# Patient Record
Sex: Male | Born: 1965 | Race: White | Hispanic: No | Marital: Married | Smoking: Never smoker
Health system: Southern US, Community
[De-identification: ages and names within clinical notes are randomized; demographics above are authoritative.]

## PROBLEM LIST (undated history)

## (undated) DIAGNOSIS — Z9289 Personal history of other medical treatment: Secondary | ICD-10-CM

## (undated) DIAGNOSIS — R112 Nausea with vomiting, unspecified: Secondary | ICD-10-CM

## (undated) DIAGNOSIS — M1990 Unspecified osteoarthritis, unspecified site: Secondary | ICD-10-CM

## (undated) DIAGNOSIS — Z9889 Other specified postprocedural states: Secondary | ICD-10-CM

## (undated) DIAGNOSIS — R319 Hematuria, unspecified: Secondary | ICD-10-CM

## (undated) DIAGNOSIS — C801 Malignant (primary) neoplasm, unspecified: Secondary | ICD-10-CM

## (undated) HISTORY — PX: INNER EAR SURGERY: SHX679

## (undated) HISTORY — PX: BACK SURGERY: SHX140

## (undated) HISTORY — PX: EYE SURGERY: SHX253

## (undated) HISTORY — PX: KNEE SURGERY: SHX244

## (undated) HISTORY — PX: TONSILLECTOMY: SUR1361

## (undated) HISTORY — PX: FRACTURE SURGERY: SHX138

---

## 2002-02-26 ENCOUNTER — Inpatient Hospital Stay (HOSPITAL_COMMUNITY): Admission: EM | Admit: 2002-02-26 | Discharge: 2002-03-05 | Payer: Self-pay | Admitting: *Deleted

## 2002-02-26 ENCOUNTER — Encounter: Payer: Self-pay | Admitting: Orthopedic Surgery

## 2002-03-02 ENCOUNTER — Encounter: Payer: Self-pay | Admitting: Orthopedic Surgery

## 2002-03-14 ENCOUNTER — Inpatient Hospital Stay (HOSPITAL_COMMUNITY): Admission: RE | Admit: 2002-03-14 | Discharge: 2002-03-18 | Payer: Self-pay | Admitting: Orthopedic Surgery

## 2002-03-14 ENCOUNTER — Encounter: Payer: Self-pay | Admitting: Orthopedic Surgery

## 2002-04-06 ENCOUNTER — Ambulatory Visit (HOSPITAL_COMMUNITY): Admission: RE | Admit: 2002-04-06 | Discharge: 2002-04-06 | Payer: Self-pay | Admitting: Orthopedic Surgery

## 2002-05-18 ENCOUNTER — Encounter: Payer: Self-pay | Admitting: Orthopedic Surgery

## 2002-05-18 ENCOUNTER — Inpatient Hospital Stay (HOSPITAL_COMMUNITY): Admission: RE | Admit: 2002-05-18 | Discharge: 2002-05-20 | Payer: Self-pay | Admitting: Orthopedic Surgery

## 2004-09-09 ENCOUNTER — Emergency Department (HOSPITAL_COMMUNITY): Admission: EM | Admit: 2004-09-09 | Discharge: 2004-09-09 | Payer: Self-pay | Admitting: Emergency Medicine

## 2012-09-06 ENCOUNTER — Other Ambulatory Visit: Payer: Self-pay | Admitting: Urology

## 2012-10-18 ENCOUNTER — Encounter (HOSPITAL_COMMUNITY): Payer: Self-pay | Admitting: Pharmacy Technician

## 2012-10-26 NOTE — Patient Instructions (Addendum)
[address] Madix D Shere  10/26/2012   Your procedure is scheduled on:  11/02/12  [facility]  Report to [facility] at    0915   AM.  Call this number if you have problems the morning of surgery: [phone]       Remember:   Do not eat food :After Midnight. Monday NIGHT as directed by office for bowel prep-   BEGIN CLEAR LIQUIDS Tuesday MORNING-  Increase fluid intake and drink until MIDNIGHT OR BEDTIME Tuesday NIGHT,  THEN NOTHING BY MOUTH AFTER MIDNIGHT Tuesday NIGHT   Take these medicines the morning of surgery with A SIP OF WATER: NONE   .  Contacts, dentures or partial plates can not be worn to surgery  Leave suitcase in the car. After surgery it may be brought to your room.  For patients admitted to the hospital, checkout time is 11:00 AM day of  discharge.             SPECIAL INSTRUCTIONS- SEE [place] PREPARING FOR SURGERY INSTRUCTION SHEET-     DO NOT WEAR JEWELRY, LOTIONS, POWDERS, OR PERFUMES.  WOMEN-- DO NOT SHAVE LEGS OR UNDERARMS FOR 12 HOURS BEFORE SHOWERS. MEN MAY SHAVE FACE.  Patients discharged the day of surgery will not be allowed to drive home. IF going home the day of surgery, you must have a driver and someone to stay with you for the first 24 hours  Name and phone number of your driver:      Wife TERESA                                                                  Please read over the following fact sheets that you were given: MRSA Information, Incentive Spirometry Sheet, Blood Transfusion Sheet  Information                                                                                   Kerriann Kamphuis  PST [phone]  [phone]                 FAILURE TO FOLLOW THESE INSTRUCTIONS MAY RESULT IN  CANCELLATION   OF YOUR SURGERY                                                  Patient Signature _____________________________

## 2012-10-27 ENCOUNTER — Encounter (HOSPITAL_COMMUNITY)
Admission: RE | Admit: 2012-10-27 | Discharge: 2012-10-27 | Disposition: A | Discharge status: Home / Self Care | Payer: Managed Care, Other (non HMO) | Source: Ambulatory Visit | Attending: Urology | Admitting: Urology

## 2012-10-27 ENCOUNTER — Encounter (HOSPITAL_COMMUNITY): Payer: Self-pay

## 2012-10-27 HISTORY — DX: Other specified postprocedural states: Z98.890

## 2012-10-27 HISTORY — DX: Unspecified osteoarthritis, unspecified site: M19.90

## 2012-10-27 HISTORY — DX: Personal history of other medical treatment: Z92.89

## 2012-10-27 HISTORY — DX: Nausea with vomiting, unspecified: R11.2

## 2012-10-27 HISTORY — DX: Malignant (primary) neoplasm, unspecified: C80.1

## 2012-10-27 LAB — CBC
HCT: 51.5 % (ref 39.0–52.0)
MCH: 30.7 pg (ref 26.0–34.0)
Platelets: 247 10*3/uL (ref 150–400)
RBC: 5.38 MIL/uL (ref 4.22–5.81)
WBC: 4.5 10*3/uL (ref 4.0–10.5)

## 2012-10-27 LAB — BASIC METABOLIC PANEL
Chloride: 101 mEq/L (ref 96–112)
Creatinine, Ser: 0.96 mg/dL (ref 0.50–1.35)
GFR calc non Af Amer: >90 mL/min (ref >90)
Potassium: 4.1 mEq/L (ref 3.5–5.1)

## 2012-11-02 ENCOUNTER — Encounter (HOSPITAL_COMMUNITY): Payer: Self-pay | Admitting: Anesthesiology

## 2012-11-02 ENCOUNTER — Ambulatory Visit (HOSPITAL_COMMUNITY): Payer: Managed Care, Other (non HMO) | Admitting: Anesthesiology

## 2012-11-02 ENCOUNTER — Observation Stay (HOSPITAL_COMMUNITY)
Admission: RE | Admit: 2012-11-02 | Discharge: 2012-11-03 | Disposition: A | Discharge status: Home / Self Care | Payer: Managed Care, Other (non HMO) | Source: Ambulatory Visit | Attending: Urology | Admitting: Urology

## 2012-11-02 ENCOUNTER — Encounter (HOSPITAL_COMMUNITY)
Admission: RE | Disposition: A | Discharge status: Home / Self Care | Payer: Self-pay | Source: Ambulatory Visit | Attending: Urology

## 2012-11-02 ENCOUNTER — Encounter (HOSPITAL_COMMUNITY): Payer: Self-pay | Admitting: General Practice

## 2012-11-02 DIAGNOSIS — R3915 Urgency of urination: Secondary | ICD-10-CM | POA: Insufficient documentation

## 2012-11-02 DIAGNOSIS — Z01812 Encounter for preprocedural laboratory examination: Secondary | ICD-10-CM | POA: Insufficient documentation

## 2012-11-02 DIAGNOSIS — C61 Malignant neoplasm of prostate: Principal | ICD-10-CM | POA: Insufficient documentation

## 2012-11-02 DIAGNOSIS — S90569A Insect bite (nonvenomous), unspecified ankle, initial encounter: Secondary | ICD-10-CM | POA: Insufficient documentation

## 2012-11-02 DIAGNOSIS — S30860A Insect bite (nonvenomous) of lower back and pelvis, initial encounter: Secondary | ICD-10-CM | POA: Insufficient documentation

## 2012-11-02 DIAGNOSIS — W57XXXA Bitten or stung by nonvenomous insect and other nonvenomous arthropods, initial encounter: Secondary | ICD-10-CM | POA: Insufficient documentation

## 2012-11-02 DIAGNOSIS — R21 Rash and other nonspecific skin eruption: Secondary | ICD-10-CM | POA: Insufficient documentation

## 2012-11-02 HISTORY — PX: ROBOT ASSISTED LAPAROSCOPIC RADICAL PROSTATECTOMY: SHX5141

## 2012-11-02 HISTORY — PX: LYMPHADENECTOMY: SHX5960

## 2012-11-02 LAB — CBC
HCT: 45 % (ref 39.0–52.0)
MCH: 31.1 pg (ref 26.0–34.0)
MCV: 87.9 fL (ref 78.0–100.0)
Platelets: 238 10*3/uL (ref 150–400)
RDW: 11.8 % (ref 11.5–15.5)

## 2012-11-02 LAB — TYPE AND SCREEN

## 2012-11-02 SURGERY — ROBOTIC ASSISTED LAPAROSCOPIC RADICAL PROSTATECTOMY LEVEL 2
Anesthesia: General | Site: Abdomen | Wound class: Clean Contaminated

## 2012-11-02 MED ORDER — HYOSCYAMINE SULFATE 0.125 MG SL SUBL
0.1250 mg | SUBLINGUAL_TABLET | SUBLINGUAL | Status: DC | PRN
  Filled 2012-11-02: qty 1

## 2012-11-02 MED ORDER — LACTATED RINGERS IV SOLN: INTRAVENOUS | Status: DC

## 2012-11-02 MED ORDER — BELLADONNA ALKALOIDS-OPIUM 16.2-60 MG RE SUPP
1.0000 | Freq: Four times a day (QID) | RECTAL | Status: DC | PRN
  Administered 2012-11-02: 1 via RECTAL
  Filled 2012-11-02: qty 1

## 2012-11-02 MED ORDER — SODIUM CHLORIDE 0.9 % IV BOLUS (SEPSIS): 1000.0000 mL | Freq: Once | INTRAVENOUS | Status: DC

## 2012-11-02 MED ORDER — PROPOFOL 10 MG/ML IV BOLUS
INTRAVENOUS | Status: DC | PRN
  Administered 2012-11-02: 200 mg via INTRAVENOUS

## 2012-11-02 MED ORDER — OXYCODONE HCL 5 MG PO TABS: 5.0000 mg | ORAL_TABLET | Freq: Once | ORAL | Status: DC | PRN

## 2012-11-02 MED ORDER — ACETAMINOPHEN 10 MG/ML IV SOLN
INTRAVENOUS | Status: AC
  Filled 2012-11-02: qty 100

## 2012-11-02 MED ORDER — MORPHINE SULFATE 2 MG/ML IJ SOLN
2.0000 mg | INTRAMUSCULAR | Status: DC | PRN
  Administered 2012-11-03: 4 mg via INTRAVENOUS
  Filled 2012-11-02: qty 2

## 2012-11-02 MED ORDER — HEPARIN SODIUM (PORCINE) 5000 UNIT/ML IJ SOLN
5000.0000 [IU] | Freq: Three times a day (TID) | INTRAMUSCULAR | Status: DC
Start: 2012-11-03 — End: 2012-11-03
  Administered 2012-11-03: 5000 [IU] via SUBCUTANEOUS
  Filled 2012-11-02 (×4): qty 1

## 2012-11-02 MED ORDER — HYDROCODONE-ACETAMINOPHEN 5-325 MG PO TABS: 1.0000 | ORAL_TABLET | Freq: Four times a day (QID) | ORAL | Status: DC | PRN

## 2012-11-02 MED ORDER — ONDANSETRON HCL 4 MG/2ML IJ SOLN
4.0000 mg | Freq: Four times a day (QID) | INTRAMUSCULAR | Status: DC | PRN
  Administered 2012-11-02 – 2012-11-03 (×2): 4 mg via INTRAVENOUS
  Filled 2012-11-02 (×2): qty 2

## 2012-11-02 MED ORDER — HYDROMORPHONE HCL PF 1 MG/ML IJ SOLN
0.2500 mg | INTRAMUSCULAR | Status: DC | PRN
  Administered 2012-11-02 (×2): 0.5 mg via INTRAVENOUS

## 2012-11-02 MED ORDER — CIPROFLOXACIN HCL 500 MG PO TABS: 500.0000 mg | ORAL_TABLET | Freq: Two times a day (BID) | ORAL | Status: DC

## 2012-11-02 MED ORDER — HYDROMORPHONE HCL PF 1 MG/ML IJ SOLN
INTRAMUSCULAR | Status: AC
  Filled 2012-11-02: qty 1

## 2012-11-02 MED ORDER — ACETAMINOPHEN 10 MG/ML IV SOLN
1000.0000 mg | Freq: Four times a day (QID) | INTRAVENOUS | Status: DC
  Administered 2012-11-02 – 2012-11-03 (×3): 1000 mg via INTRAVENOUS
  Filled 2012-11-02 (×4): qty 100

## 2012-11-02 MED ORDER — DEXAMETHASONE SODIUM PHOSPHATE 10 MG/ML IJ SOLN
INTRAMUSCULAR | Status: DC | PRN
  Administered 2012-11-02: 4 mg via INTRAVENOUS

## 2012-11-02 MED ORDER — ATROPINE SULFATE 0.4 MG/ML IJ SOLN
INTRAMUSCULAR | Status: DC | PRN
  Administered 2012-11-02: 0.4 mg via INTRAVENOUS

## 2012-11-02 MED ORDER — LACTATED RINGERS IV SOLN
INTRAVENOUS | Status: DC | PRN
  Administered 2012-11-02: 14:00:00

## 2012-11-02 MED ORDER — DOCUSATE SODIUM 100 MG PO CAPS
100.0000 mg | ORAL_CAPSULE | Freq: Two times a day (BID) | ORAL | Status: DC
  Filled 2012-11-02 (×2): qty 1

## 2012-11-02 MED ORDER — STERILE WATER FOR IRRIGATION IR SOLN
Status: DC | PRN
  Administered 2012-11-02: 3000 mL

## 2012-11-02 MED ORDER — NEOSTIGMINE METHYLSULFATE 1 MG/ML IJ SOLN
INTRAMUSCULAR | Status: DC | PRN
  Administered 2012-11-02: 4 mg via INTRAVENOUS

## 2012-11-02 MED ORDER — HEPARIN SODIUM (PORCINE) 5000 UNIT/ML IJ SOLN
5000.0000 [IU] | INTRAMUSCULAR | Status: AC
  Administered 2012-11-02: 5000 [IU] via SUBCUTANEOUS
  Filled 2012-11-02: qty 1

## 2012-11-02 MED ORDER — CEFAZOLIN SODIUM-DEXTROSE 2-3 GM-% IV SOLR
2.0000 g | INTRAVENOUS | Status: AC
  Administered 2012-11-02: 2 g via INTRAVENOUS

## 2012-11-02 MED ORDER — OXYCODONE-ACETAMINOPHEN 5-325 MG PO TABS: 1.0000 | ORAL_TABLET | ORAL | Status: DC | PRN

## 2012-11-02 MED ORDER — HYDROMORPHONE HCL PF 1 MG/ML IJ SOLN
INTRAMUSCULAR | Status: DC | PRN
  Administered 2012-11-02 (×5): .4 mg via INTRAVENOUS

## 2012-11-02 MED ORDER — ACETAMINOPHEN 10 MG/ML IV SOLN
INTRAVENOUS | Status: DC | PRN
  Administered 2012-11-02: 1000 mg via INTRAVENOUS

## 2012-11-02 MED ORDER — INDIGOTINDISULFONATE SODIUM 8 MG/ML IJ SOLN
INTRAMUSCULAR | Status: AC
  Filled 2012-11-02: qty 10

## 2012-11-02 MED ORDER — HEPARIN SODIUM (PORCINE) 1000 UNIT/ML IJ SOLN
INTRAMUSCULAR | Status: AC
  Filled 2012-11-02: qty 1

## 2012-11-02 MED ORDER — ACETAMINOPHEN 10 MG/ML IV SOLN: 1000.0000 mg | Freq: Once | INTRAVENOUS | Status: DC | PRN

## 2012-11-02 MED ORDER — LIDOCAINE HCL (PF) 2 % IJ SOLN
INTRAMUSCULAR | Status: DC | PRN
  Administered 2012-11-02: 100 mg

## 2012-11-02 MED ORDER — GLYCOPYRROLATE 0.2 MG/ML IJ SOLN
INTRAMUSCULAR | Status: DC | PRN
  Administered 2012-11-02: 0.2 mg via INTRAVENOUS
  Administered 2012-11-02: .6 mg via INTRAVENOUS

## 2012-11-02 MED ORDER — OXYCODONE HCL 5 MG/5ML PO SOLN: 5.0000 mg | Freq: Once | ORAL | Status: DC | PRN

## 2012-11-02 MED ORDER — INDIGOTINDISULFONATE SODIUM 8 MG/ML IJ SOLN
INTRAMUSCULAR | Status: DC | PRN
  Administered 2012-11-02 (×2): 5 mL via INTRAVENOUS

## 2012-11-02 MED ORDER — SODIUM CHLORIDE 0.9 % IR SOLN
Status: DC | PRN
  Administered 2012-11-02: 1000 mL

## 2012-11-02 MED ORDER — MEPERIDINE HCL 50 MG/ML IJ SOLN: 6.2500 mg | INTRAMUSCULAR | Status: DC | PRN

## 2012-11-02 MED ORDER — CEFAZOLIN SODIUM-DEXTROSE 2-3 GM-% IV SOLR
INTRAVENOUS | Status: AC
  Filled 2012-11-02: qty 50

## 2012-11-02 MED ORDER — BUPIVACAINE-EPINEPHRINE PF 0.25-1:200000 % IJ SOLN
INTRAMUSCULAR | Status: AC
  Filled 2012-11-02: qty 30

## 2012-11-02 MED ORDER — SENNOSIDES-DOCUSATE SODIUM 8.6-50 MG PO TABS
2.0000 | ORAL_TABLET | Freq: Every day | ORAL | Status: DC
  Administered 2012-11-02: 2 via ORAL
  Filled 2012-11-02 (×2): qty 2

## 2012-11-02 MED ORDER — ONDANSETRON HCL 4 MG/2ML IJ SOLN
INTRAMUSCULAR | Status: DC | PRN
  Administered 2012-11-02: 4 mg via INTRAVENOUS

## 2012-11-02 MED ORDER — BUPIVACAINE LIPOSOME 1.3 % IJ SUSP
20.0000 mL | Freq: Once | INTRAMUSCULAR | Status: DC
  Filled 2012-11-02: qty 20

## 2012-11-02 MED ORDER — LACTATED RINGERS IV SOLN
INTRAVENOUS | Status: DC | PRN
  Administered 2012-11-02 (×2): via INTRAVENOUS

## 2012-11-02 MED ORDER — SODIUM CHLORIDE 0.9 % IJ SOLN
INTRAMUSCULAR | Status: DC | PRN
  Administered 2012-11-02: 14:00:00

## 2012-11-02 MED ORDER — FENTANYL CITRATE 0.05 MG/ML IJ SOLN
INTRAMUSCULAR | Status: DC | PRN
  Administered 2012-11-02 (×5): 100 ug via INTRAVENOUS

## 2012-11-02 MED ORDER — PROMETHAZINE HCL 25 MG/ML IJ SOLN: 6.2500 mg | INTRAMUSCULAR | Status: DC | PRN

## 2012-11-02 MED ORDER — DIPHENHYDRAMINE HCL 12.5 MG/5ML PO ELIX: 12.5000 mg | ORAL_SOLUTION | Freq: Four times a day (QID) | ORAL | Status: DC | PRN

## 2012-11-02 MED ORDER — CISATRACURIUM BESYLATE (PF) 10 MG/5ML IV SOLN
INTRAVENOUS | Status: DC | PRN
  Administered 2012-11-02: 6 mg via INTRAVENOUS
  Administered 2012-11-02: 14 mg via INTRAVENOUS
  Administered 2012-11-02 (×3): 4 mg via INTRAVENOUS
  Administered 2012-11-02: 6 mg via INTRAVENOUS

## 2012-11-02 MED ORDER — SODIUM CHLORIDE 0.9 % IV SOLN
INTRAVENOUS | Status: DC
  Administered 2012-11-02 – 2012-11-03 (×2): via INTRAVENOUS

## 2012-11-02 MED ORDER — MIDAZOLAM HCL 5 MG/5ML IJ SOLN
INTRAMUSCULAR | Status: DC | PRN
  Administered 2012-11-02: 2 mg via INTRAVENOUS

## 2012-11-02 MED ORDER — DIPHENHYDRAMINE HCL 50 MG/ML IJ SOLN: 12.5000 mg | Freq: Four times a day (QID) | INTRAMUSCULAR | Status: DC | PRN

## 2012-11-02 SURGICAL SUPPLY — 57 items
APPLIER CLIP 5 13 M/L LIGAMAX5 (MISCELLANEOUS) ×3
APR CLP MED LRG 5 ANG JAW (MISCELLANEOUS) ×2
CANISTER SUCTION 2500CC (MISCELLANEOUS) ×3 IMPLANT
CATH FOLEY 2WAY SLVR 30CC 16FR (CATHETERS) ×3 IMPLANT
CATH ROBINSON RED A/P 16FR (CATHETERS) ×3 IMPLANT
CATH ROBINSON RED A/P 8FR (CATHETERS) ×3 IMPLANT
CATH TIEMANN FOLEY 18FR 5CC (CATHETERS) ×3 IMPLANT
CHLORAPREP W/TINT 26ML (MISCELLANEOUS) ×3 IMPLANT
CLIP APPLIE 5 13 M/L LIGAMAX5 (MISCELLANEOUS) ×2 IMPLANT
CLIP LIGATING HEM O LOK PURPLE (MISCELLANEOUS) ×7 IMPLANT
CLOTH BEACON ORANGE TIMEOUT ST (SAFETY) ×3 IMPLANT
CORD HIGH FREQUENCY UNIPOLAR (ELECTROSURGICAL) ×3 IMPLANT
COVER SURGICAL LIGHT HANDLE (MISCELLANEOUS) ×3 IMPLANT
COVER TIP SHEARS 8 DVNC (MISCELLANEOUS) ×2 IMPLANT
COVER TIP SHEARS 8MM DA VINCI (MISCELLANEOUS) ×1
CUTTER ECHEON FLEX ENDO 45 340 (ENDOMECHANICALS) ×3 IMPLANT
DECANTER SPIKE VIAL GLASS SM (MISCELLANEOUS) ×3 IMPLANT
DRAPE SURG IRRIG POUCH 19X23 (DRAPES) ×3 IMPLANT
DRSG TEGADERM 6X8 (GAUZE/BANDAGES/DRESSINGS) ×6 IMPLANT
ELECT REM PT RETURN 9FT ADLT (ELECTROSURGICAL) ×3
ELECTRODE REM PT RTRN 9FT ADLT (ELECTROSURGICAL) ×2 IMPLANT
GAUZE SPONGE 2X2 8PLY STRL LF (GAUZE/BANDAGES/DRESSINGS) ×2 IMPLANT
GLOVE BIOGEL M 7.0 STRL (GLOVE) IMPLANT
GLOVE BIOGEL M STRL SZ7.5 (GLOVE) ×1 IMPLANT
GLOVE BIOGEL PI IND STRL 6.5 (GLOVE) IMPLANT
GLOVE BIOGEL PI IND STRL 7.5 (GLOVE) ×2 IMPLANT
GLOVE BIOGEL PI INDICATOR 6.5 (GLOVE) ×2
GLOVE BIOGEL PI INDICATOR 7.5 (GLOVE) ×3
GLOVE ECLIPSE 7.0 STRL STRAW (GLOVE) ×4 IMPLANT
GLOVE SURG SS PI 8.5 STRL IVOR (GLOVE) ×2
GLOVE SURG SS PI 8.5 STRL STRW (GLOVE) IMPLANT
GOWN PREVENTION PLUS XLARGE (GOWN DISPOSABLE) ×4 IMPLANT
GOWN STRL NON-REIN LRG LVL3 (GOWN DISPOSABLE) ×6 IMPLANT
GOWN STRL REIN 2XL XLG LVL4 (GOWN DISPOSABLE) ×1 IMPLANT
GOWN STRL REIN 3XL XLG LVL4 (GOWN DISPOSABLE) ×1 IMPLANT
GOWN STRL REIN XL XLG (GOWN DISPOSABLE) ×7 IMPLANT
HOLDER FOLEY CATH W/STRAP (MISCELLANEOUS) ×3 IMPLANT
IV LACTATED RINGERS 1000ML (IV SOLUTION) ×3 IMPLANT
KIT ACCESSORY DA VINCI DISP (KITS) ×1
KIT ACCESSORY DVNC DISP (KITS) ×2 IMPLANT
NDL SAFETY ECLIPSE 18X1.5 (NEEDLE) ×2 IMPLANT
NEEDLE HYPO 18GX1.5 SHARP (NEEDLE) ×3
PACK ROBOT UROLOGY CUSTOM (CUSTOM PROCEDURE TRAY) ×3 IMPLANT
RELOAD GREEN ECHELON 45 (STAPLE) ×3 IMPLANT
SCRUB PCMX 4 OZ (MISCELLANEOUS) ×1 IMPLANT
SET TUBE IRRIG SUCTION NO TIP (IRRIGATION / IRRIGATOR) ×3 IMPLANT
SOLUTION ELECTROLUBE (MISCELLANEOUS) ×3 IMPLANT
SPONGE GAUZE 2X2 STER 10/PKG (GAUZE/BANDAGES/DRESSINGS) ×1
SUT ETHILON 3 0 PS 1 (SUTURE) ×1 IMPLANT
SUT VICRYL 0 UR6 27IN ABS (SUTURE) ×6 IMPLANT
SUT VLOC BARB 180 ABS3/0GR12 (SUTURE) ×3
SUTURE VLOC BRB 180 ABS3/0GR12 (SUTURE) IMPLANT
SYR 27GX1/2 1ML LL SAFETY (SYRINGE) ×3 IMPLANT
TOWEL OR 17X26 10 PK STRL BLUE (TOWEL DISPOSABLE) ×2 IMPLANT
TOWEL OR NON WOVEN STRL DISP B (DISPOSABLE) ×3 IMPLANT
TROCAR 12M 150ML BLUNT (TROCAR) ×1 IMPLANT
WATER STERILE IRR 1500ML POUR (IV SOLUTION) ×6 IMPLANT

## 2012-11-02 NOTE — Transfer of Care (Signed)
Immediate Anesthesia Transfer of Care Note  Patient: Sean Estes  Procedure(s) Performed: Procedure(s) (LRB): ROBOTIC ASSISTED LAPAROSCOPIC RADICAL PROSTATECTOMY LEVEL 2 (N/A) LYMPHADENECTOMY (Bilateral)  Patient Location: PACU  Anesthesia Type: General  Level of Consciousness: sedated, patient cooperative and responds to stimulaton  Airway & Oxygen Therapy: Patient Spontanous Breathing and Patient connected to face mask oxgen  Post-op Assessment: Report given to PACU RN and Post -op Vital signs reviewed and stable  Post vital signs: Reviewed and stable  Complications: No apparent anesthesia complications

## 2012-11-02 NOTE — Progress Notes (Signed)
Dr. Margarita Grizzle in to see patient, pt. Has poison ivy on bilateral lower legs, one spot on left forearm, and tick marks on lower abdomen, order received to wrap lower legs with ace wraps, and continue with surgery.

## 2012-11-02 NOTE — Anesthesia Preprocedure Evaluation (Addendum)
Anesthesia Evaluation  Patient identified by MRN, date of birth, ID band Patient awake    Reviewed: Allergy & Precautions, H&P , NPO status , Patient's Chart, lab work & pertinent test results  History of Anesthesia Complications (+) PONV  Airway Mallampati: II TM Distance: >3 FB Neck ROM: Full    Dental  (+) Teeth Intact and Dental Advisory Given   Pulmonary neg pulmonary ROS,  breath sounds clear to auscultation        Cardiovascular negative cardio ROS  Rhythm:Regular Rate:Normal     Neuro/Psych negative neurological ROS  negative psych ROS   GI/Hepatic negative GI ROS, Neg liver ROS,   Endo/Other  negative endocrine ROS  Renal/GU negative Renal ROS     Musculoskeletal negative musculoskeletal ROS (+)   Abdominal   Peds  Hematology negative hematology ROS (+)   Anesthesia Other Findings   Reproductive/Obstetrics                           Anesthesia Physical Anesthesia Plan  ASA: II  Anesthesia Plan: General   Post-op Pain Management:    Induction: Intravenous  Airway Management Planned: Oral ETT  Additional Equipment:   Intra-op Plan:   Post-operative Plan: Extubation in OR  Informed Consent: I have reviewed the patients History and Physical, chart, labs and discussed the procedure including the risks, benefits and alternatives for the proposed anesthesia with the patient or authorized representative who has indicated his/her understanding and acceptance.   Dental advisory given  Plan Discussed with: CRNA  Anesthesia Plan Comments:         Anesthesia Quick Evaluation

## 2012-11-02 NOTE — Anesthesia Postprocedure Evaluation (Signed)
Anesthesia Post Note  Patient: Sean Estes  Procedure(s) Performed: Procedure(s) (LRB): ROBOTIC ASSISTED LAPAROSCOPIC RADICAL PROSTATECTOMY LEVEL 2 (N/A) LYMPHADENECTOMY (Bilateral)  Anesthesia type: General  Patient location: PACU  Post pain: Pain level controlled  Post assessment: Post-op Vital signs reviewed  Last Vitals: BP 118/72  Pulse 59  Temp(Src) 36.9 C (Oral)  Resp 12  Ht 6\' 1"  (1.854 m)  Wt 185 lb (83.915 kg)  BMI 24.41 kg/m2  SpO2 98%  Post vital signs: Reviewed  Level of consciousness: sedated  Complications: No apparent anesthesia complications

## 2012-11-02 NOTE — H&P (Signed)
Urology History and Physical Exam  CC: Prostate cancer  HPI:  47 year old male presents today for radical robotic prostatectomy with bilateral pelvic lymph node dissection for prostate cancer. This was discovered on prostate biopsy for an elevated PSA to 5.22. The final pathology report revealed Gleason 3+4 equals 7 involving 4 cores, 3+3 equals 6 involving one core, and atypical glands involving 2 cores. This involved the left base and left lateral portions as well as the bilateral apex. He has preoperative erectile dysfunction for which he is taking levitra. His prostate volume was noted to be small at 11 cc. We have discussed risks, benefits, alternatives, and likelihood of achieving goals.  He presents today with a rash on his bilateral lower trimmed is below the knees. This appears to be some type of contact dermatitis such as poison ivy. He has a tic bite on his left lower quadrant abdomen and his right medial thigh. Neither of these are pustular nor is there a large amount of erythema surrounding them. I feel this is appropriate to continue on with surgery.  PMH: Past Medical History  Diagnosis Date  . History of blood transfusion   . Arthritis   . Cancer     prostate  . PONV (postoperative nausea and vomiting)     PSH: Past Surgical History  Procedure Laterality Date  . Inner ear surgery Right   . Knee surgery Left     ORIF x fracture of 12 bones/plates and screws retained/  LIMITED MOBILITY LEFT LEG  . Back surgery      lumbar/ discectomy  . Eye surgery Right     repair torn retina right with lens implant  . Fracture surgery Left     PATELLA/ LOWER LEG_ LIMITED MOBILITY    Allergies: Allergies  Allergen Reactions  . Codeine     Cant urinate, itching    Medications: No prescriptions prior to admission     Social History: History   Social History  . Marital Status: Married    Spouse Name: N/A    Number of Children: N/A  . Years of Education: N/A    Occupational History  . Not on file.   Social History Main Topics  . Smoking status: Never Smoker   . Smokeless tobacco: Never Used  . Alcohol Use: Yes     Comment: 3-4 shots liquor week  . Drug Use: No  . Sexually Active: Not on file   Other Topics Concern  . Not on file   Social History Narrative  . No narrative on file    Family History: No family history on file.  Review of Systems: Positive: Rash (Mentioned in HPI). Negative: Fever, chest pain, or SOB.  A further 10 point review of systems was negative except what is listed in the HPI.  Physical Exam: Filed Vitals:   11/02/12 0911  BP: 126/86  Pulse: 72  Temp: 98 F (36.7 C)  Resp: 18    General: No acute distress.  Awake. Head:  Normocephalic.  Atraumatic. ENT:  EOMI.  Mucous membranes moist Neck:  Supple.  No lymphadenopathy. CV:  S1 present. S2 present. Regular rate. Pulmonary: Equal effort bilaterally.  Clear to auscultation bilaterally. Abdomen: Soft.  Non- tender to palpation. Skin:  Normal turgor.  No visible rash. Extremity: No gross deformity of bilateral upper extremities.  No gross deformity of    bilateral lower extremities. Bilateral lower extremity papular erythematous rash. Neurologic: Alert. Appropriate mood.    Studies:  No results found  for this basename: HGB, WBC, PLT,  in the last 72 hours  No results found for this basename: NA, K, CL, CO2, BUN, CREATININE, CALCIUM, MAGNESIUM, GFRNONAA, GFRAA,  in the last 72 hours   No results found for this basename: PT, INR, APTT,  in the last 72 hours   No components found with this basename: ABG,     Assessment:  Prostate cancer  Plan: To OR for radical robotic prostatectomy with bilateral pelvic lymph node dissection. Nerve sparing will be determined at the time of surgery.

## 2012-11-02 NOTE — Progress Notes (Signed)
GU post-op check.  This patient is doing well following prostatectomy. Pain controlled. Has urinary urgency.  I explained the surgical findings and course of the surgery to the patient.  PE:  Filed Vitals:   11/02/12 1830  BP: 118/72  Pulse: 59  Temp: 98.4 F (36.9 C)  Resp: 12   Gen: NAD Abd: appropriately TTP, JP draining serosanguinous fluid. GU: foley draining clear yellow urine  Assessment: Prostate cancer  Plan:  Continue IV fluids.  Pain control.  Regular diet.  Ambulate.  Incentive spirometry.

## 2012-11-02 NOTE — Op Note (Signed)
DATE OF PROCEDURE: 11/02/12   OPERATIVE REPORT  SURGEON: Natalia Leatherwood, MD  ASSISTANT:  Pecola Leisure, PA   PREOPERATIVE DIAGNOSIS: Prostate cancer.  POSTOPERATIVE DIAGNOSIS: Prostate cancer.   PROCEDURE PERFORMED:  Robotic-assisted laparoscopic radical prostatectomy Bilateral non-nerve sparing. Bilateral pelvic lymph node dissection.   ESTIMATED BLOOD LOSS: 150 cc.  DRAINS: Jackson-Pratt drain, left lower quadrant and Foley catheter.   SPECIMEN: Prostate sent with seminal vesicles in its entirety.   FINDINGS: Small prostate. Tissue adherent to prostate with concern over extra-prostatic disease.   HISTORY OF PRESENT ILLNESS: 47 year old male with prostate cancer. After evaluation and discussion of management options, he elected for surgical extirpation of his prostate.   PROCEDURE IN DETAIL: Informed consent was obtained. He received subcutaneous heparin for DVT prophylaxis before being taken to the OR. The patient was taken to the operating room, where he was placed in the supine position. IV antibiotics were infused and general anesthesia was induced. A time- out was performed, in which the correct patient, surgical site, and procedure were identified and agreed upon by the team. Hair was removed  from his abdomen and genitals and then he was placed in a dorsal  lithotomy position. All pertinent neurovascular pressure points were padded appropriately. His arms were tucked to the side using gel padding. After this, his abdomen and genitals were prepped and draped in the usual sterile fashion. A Foley catheter was placed on the field, 10 cc of sterile water was placed into the balloon.   He was then placed in a Trendelenburg position. Access was gained for laparoscopic procedure by using the Hasson technique by cutting down to the fascia in the midline above the umbilicus. Once the peritoneum was accessed, a 10 mm  port was placed and abdomen was insufflated with carbon dioxide.  Opening pressures were initially low so insufflation was continued. Next, markings were made for placement of the 1st & 3rd robotic arm ports in the usual areas with the ports being placed approximately 10 cm from the midline on either  side of camera (2nd) arm.  A 10 mm assistant port was placed on the far right lateral side of the abdomen. A 5 mm assistant port was placed between the right robotic (1st arm) and the camera port (2nd arm). The 4th robotic arm port was placed at the far left lateral side of the abdomen. All these were placed under direct visualization, and the robot was docked to the robotic ports.  After this was done, the urachal remnant was identified and divided and the bladder was  Dissected from the anterior wall of the abdomen. This was taken down to  the endopelvic fascia bilaterally and the perineum was split down to the  vas deferens bilaterally. The vas deferens was divided bipolar and monopolar electrocautery.   Attention was turned to the right pelvis. The peritoneum was incised until the right external iliac vein was identified. Careful dissection was performed to release the nodal packet lateral to the vein. Dissection was then carried out inferior and medially to the vein until the lateral pelvic wall was encountered. Gentle dissection was then carried proximally and distally along the vein. The obturator nerve was identified early at its proximal and distal ends. Dissection of this packet was carried out making sure not to cause injury to the obturator nerve. Dissection was then carried proximally until the bifurcation of the iliac arteries was encountered. Dissection was carried laterally from the external iliac artery until the genitofemoral nerve was  encountered making sure not to damage this structure. Lymphatic tissue was then removed from the area overlying the common iliac artery and the areas overlying the bifurcation of the iliac artery. Hemostasis and lymph vessels  were controlled with Weck surgical clips. The lymph node packet was then removed and sent for permanent pathology.  Attention was turned to the left pelvis. The peritoneum was incised until the left external iliac vein was identified. Careful dissection was performed to release the nodal packet lateral to the vein. Dissection was then carried out inferior and medially to the vein until the lateral pelvic wall was encountered. Gentle dissection was then carried proximally and distally along the vein. The obturator nerve was identified early at its proximal and distal ends. Dissection of this packet was carried out making sure not to cause injury to the obturator nerve. Dissection was then carried proximally until the bifurcation of the iliac arteries was encountered. Dissection was carried laterally from the external iliac artery until the genitofemoral nerve was encountered making sure not to damage this structure. Lymphatic tissue was then removed from the area overlying the common iliac artery and the areas overlying the bifurcation of the iliac artery. Hemostasis and lymph vessels were controlled with Weck surgical clips. The lymph node packet was then removed and sent for permanent pathology.  After this was done, the prograsp was used to  retract the bladder cephalad. After this was done, the endopelvic fascia was incised  bilaterally and carried anterior and posteriorly bilaterally. The  puboprostatic ligaments were then divided and then the stapler device  was used to divide and ligate the dorsal venous complex.   After this was complete, attention was turned back to the junction between the prostate and bladder neck.  Dissection was carried down between the prostate and the bladder until  the Foley catheter was encountered. It was deflated and then placed  through the dissection site and then anterior traction was placed by  grasping the catheter with the 4th robot arm. The remainder of the  bladder neck  was then dissected out, and dissection was carried down posteriorly making sure to avoid reentry into the bladder until the seminal vesicles were encountered.   The seminal vesicles and vas deferens were then dissected out completely bilaterally. After  these were done, they were placed on traction anteriorly and blunt  dissection was carried out between the rectum and the prostate.   Attention was turned to the right prostatic pedicle. It was evaluated and found to have no good surgical plane.  It was felt that further attempt at nerve sparing on this side would jeopardize cancer control and a wide dissection of this side was carried out. The right prostatic pedicle was controlled with sharp dissection and Hem-o-lok clips.   Attention was turned to the left prostatic pedicle. It was evaluated and found to have no good surgical plane.  It was felt that further attempt at nerve sparing on this left side would jeopardize cancer control and a wide dissection of this side was carried out. The left prostatic pedicle was controlled with sharp dissection and Hem-o-lok clips.   Dissection was then carried up to the urethra which was all that remained attaching the prostate to the body. The urethra was  then divided with scissors and the prostate was placed to the side.   The pelvis was irrigated and flushing of air into the rectal catheter showed no air bubbles.    After this, anastomosis of the bladder  neck to the urethra was carried out. A 3-0 V-lock suture was used to approximate the posterior bladder to the posterior urethra.   Double-armed Monocryl suture was used in a running fashion to perform the anastomosis. I was careful to ensure mucosal to mucosal approximation.  After this was done, a Foley catheter was placed through the anastomosis and into the bladder. It was irrigated and found to be water tight. It was tied down and the suture needles were removed from the body.   A  Jackson-Pratt drain was placed through the port of the 4th robot arm. The specimen was placed in the EndoCatch bag. The robot was  undocked and the operating table was leveled. The EndoCatch bag was removed from the body by enlarging  the midline of the umbilicus. All ports were checked and found to be  free of bleeding. The 10 mm assistant port was closed with a suture  passer under direct visualization. The fascia of the umbilical port was close with interrupted vicryl suture in a figure-of-eight fashion until there was no defect palpable. Care was taken to avoid incorporation of intra-abdominal contents into the closure. After this was done, the drain was sutured into place, and the local injection with Exparel was performed. All wounds were irrigated with  sterile normal saline and then closed with staples. The deep campers and scarpa's was closed with interrupted vicryl.   Tegaderm and drain gauze was placed over the Jackson-Pratt drain and  Foley catheter was placed to closed drainage. The patient was placed  back in supine position. Anesthesia was reversed. He was taken to PACU  in stable condition. He will be kept overnight for evaluation.    All counts were correct at the end of the case.

## 2012-11-03 ENCOUNTER — Encounter (HOSPITAL_COMMUNITY): Payer: Self-pay | Admitting: Urology

## 2012-11-03 LAB — BASIC METABOLIC PANEL
CO2: 28 mEq/L (ref 19–32)
Calcium: 8.7 mg/dL (ref 8.4–10.5)
Chloride: 102 mEq/L (ref 96–112)
Creatinine, Ser: 0.9 mg/dL (ref 0.50–1.35)
Glucose, Bld: 143 mg/dL — ABNORMAL HIGH (ref 70–99)

## 2012-11-03 LAB — HEMOGLOBIN AND HEMATOCRIT, BLOOD: HCT: 42.4 % (ref 39.0–52.0)

## 2012-11-03 MED ORDER — BISACODYL 10 MG RE SUPP: 10.0000 mg | Freq: Two times a day (BID) | RECTAL | Status: DC

## 2012-11-03 MED ORDER — SENNOSIDES-DOCUSATE SODIUM 8.6-50 MG PO TABS: 1.0000 | ORAL_TABLET | Freq: Two times a day (BID) | ORAL | Status: DC

## 2012-11-03 MED ORDER — CIPROFLOXACIN HCL 500 MG PO TABS: 500.0000 mg | ORAL_TABLET | Freq: Two times a day (BID) | ORAL | Status: DC

## 2012-11-03 MED ORDER — HYOSCYAMINE SULFATE 0.125 MG PO TABS: 0.1250 mg | ORAL_TABLET | ORAL | Status: DC | PRN

## 2012-11-03 MED ORDER — BISACODYL 10 MG RE SUPP
10.0000 mg | Freq: Two times a day (BID) | RECTAL | Status: DC
  Administered 2012-11-03: 10 mg via RECTAL
  Filled 2012-11-03: qty 1

## 2012-11-03 MED ORDER — OXYCODONE-ACETAMINOPHEN 5-325 MG PO TABS: 1.0000 | ORAL_TABLET | ORAL | Status: DC | PRN

## 2012-11-03 MED ORDER — OXYBUTYNIN CHLORIDE 5 MG PO TABS: 5.0000 mg | ORAL_TABLET | Freq: Four times a day (QID) | ORAL | Status: DC | PRN

## 2012-11-03 NOTE — Discharge Summary (Signed)
Physician Discharge Summary  Patient ID: ELCHONON MAXSON MRN: [id number] DOB/AGE: 02/07/1966 47 y.o.  Admit date: 11/02/2012 Discharge date: 11/03/2012  Admission Diagnoses: Prostate cancer  Discharge Diagnoses:  Prostate cancer  Discharged Condition: good  Hospital Course:  Patient admitted following robotic prostatectomy and bilateral pelvic lymph node dissection. He was able to ambulate early. He was able to tolerate a regular diet and control his pain with PO medications. His JP drain was discontinued and he was taught foley catheter care.  Consults: None  Significant Diagnostic Studies: None  Treatments: surgery: robotic prostatectomy and bilateral pelvic lymph node dissection  Discharge Exam: Blood pressure 106/60, pulse 69, temperature 98.1 F (36.7 C), temperature source Oral, resp. rate 18, height 6\' 1"  (1.854 m), weight 83.915 kg (185 lb), SpO2 97.00%. Refer to PE from progress note on date of discharge.  Disposition: Home- self care.  Discharge Orders   Future Orders Complete By Expires     Discharge patient  As directed         Medication List    STOP taking these medications       Fish Oil 1200 MG Caps     glucosamine-chondroitin 500-400 MG tablet     multivitamin with minerals Tabs     tamsulosin 0.4 MG Caps  Commonly known as:  FLOMAX      TAKE these medications       bisacodyl 10 MG suppository  Commonly known as:  DULCOLAX  Place 1 suppository (10 mg total) rectally 2 (two) times daily.     ciprofloxacin 500 MG tablet  Commonly known as:  CIPRO  Take 1 tablet (500 mg total) by mouth 2 (two) times daily. Start day prior to office visit for foley removal     hyoscyamine 0.125 MG tablet  Commonly known as:  LEVSIN, ANASPAZ  Take 1 tablet (0.125 mg total) by mouth every 4 (four) hours as needed for cramping (bladder spasms).     oxybutynin 5 MG tablet  Commonly known as:  DITROPAN  Take 1 tablet (5 mg total) by mouth every 6 (six) hours as  needed (bladder spasms).     oxyCODONE-acetaminophen 5-325 MG per tablet  Commonly known as:  PERCOCET  Take 1-2 tablets by mouth every 4 (four) hours as needed for pain.     Potassium 99 MG Tabs  Take 2 tablets by mouth daily.     senna-docusate 8.6-50 MG per tablet  Commonly known as:  SENOKOT S  Take 1 tablet by mouth 2 (two) times daily.           Follow-up Information   Follow up with Milford Cage, MD On 11/14/2012. (at 9:15)    Contact information:   [address] [place] FLO[address] [place] [address] [phone]       Signed: Milford Cage 11/03/2012, 7:35 AM

## 2012-11-03 NOTE — Care Management Note (Signed)
    Page 1 of 1   11/03/2012     3:34:12 PM   CARE MANAGEMENT NOTE 11/03/2012  Patient:  Sean Estes, Sean Estes   Account Number:  [id number]  Date Initiated:  11/03/2012  Documentation initiated by:  Lanier Clam  Subjective/Objective Assessment:   ADMITTED W/PROSTATE CA     Action/Plan:   FROM HOME   Anticipated DC Date:  11/03/2012   Anticipated DC Plan:  HOME/SELF CARE      DC Planning Services  CM consult      Choice offered to / List presented to:             Status of service:  Completed, signed off Medicare Important Message given?   (If response is "NO", the following Medicare IM given date fields will be blank) Date Medicare IM given:   Date Additional Medicare IM given:    Discharge Disposition:  HOME/SELF CARE  Per UR Regulation:  Reviewed for med. necessity/level of care/duration of stay  If discussed at Long Length of Stay Meetings, dates discussed:    Comments:  11/03/12 Sean Ganser RN,BSN NCM [phone] POD#1 RADICAL PROSTATECTOMY.

## 2012-11-03 NOTE — Progress Notes (Signed)
Urology Progress Note  Subjective:     No acute urologic events overnight. Negative BM or flatus. Nausea last night resolved today. Tolerating regular diet. Patient able to ambulate last night. Pain controlled with PO medication. Complains of losing the crown of his tooth during since coming to the hospital. He has the fragment with him.  We reviewed discharge medications and instructions.  ROS: Negative: Chest pain or SOB.  Objective:  Patient Vitals for the past 24 hrs:  BP Temp Temp src Pulse Resp SpO2 Height Weight  11/03/12 0518 106/60 mmHg 98.1 F (36.7 C) Oral 69 18 97 % - -  11/02/12 2008 114/73 mmHg 98.2 F (36.8 C) Oral 71 18 99 % - -  11/02/12 1856 - - - - - - 6\' 1"  (1.854 m) 83.915 kg (185 lb)  11/02/12 1830 118/72 mmHg 98.4 F (36.9 C) - 59 12 98 % - -  11/02/12 1815 131/68 mmHg 98.1 F (36.7 C) - 68 7 99 % - -  11/02/12 1800 139/80 mmHg - - 66 12 100 % - -  11/02/12 1745 146/71 mmHg - - 90 20 99 % - -  11/02/12 1730 144/78 mmHg - - 75 17 100 % - -  11/02/12 1715 151/87 mmHg 99 F (37.2 C) - 72 8 100 % - -  11/02/12 1134 - - - - - - 6\' 1"  (1.854 m) 83.915 kg (185 lb)  11/02/12 0911 126/86 mmHg 98 F (36.7 C) Oral 72 18 100 % - -    Physical Exam: General:  No acute distress, awake Cardiovascular:    [x]   S1/S2 present, RRR  []   Irregularly irregular Chest:  CTA-B Abdomen:               []  Soft, appropriately TTP  []  Soft, NTTP  [x]  Soft, appropriately TTP, incision(s) dressed, JP serosanguinous.  Genitourinary: Negative edema Foley:  Draining clear yellow urine.    I/O last 3 completed shifts: In: 4635.4 [P.O.:360; I.V.:2885.4; Other:190; IV Piggyback:1200] Out: 2310 [Urine:2075; Drains:110; Blood:125]  Recent Labs     11/02/12  1903  11/03/12  0423  HGB  15.9  14.5  WBC  14.8*   --   PLT  238   --     Recent Labs     11/02/12  1903  11/03/12  0423  NA   --   136  K   --   4.2  CL   --   102  CO2   --   28  BUN   --   12  CREATININE   0.86  0.90  CALCIUM   --   8.7  GFRNONAA  >90  >90  GFRAA  >90  >90     No results found for this basename: PT, INR, APTT,  in the last 72 hours   No components found with this basename: ABG,     Length of stay: 1 days.  Assessment: Prostate cancer POD#1 Robotic prostatectomy with bilateral pelvic lymph node dissection.   Plan: -Saline lock IV. -Discontinue JP drain. -Nurse to teach foley cath care. -Discharge home.   Natalia Leatherwood, MD [phone]

## 2013-01-31 ENCOUNTER — Other Ambulatory Visit: Payer: Self-pay | Admitting: Otolaryngology

## 2013-01-31 DIAGNOSIS — H7100 Cholesteatoma of attic, unspecified ear: Secondary | ICD-10-CM

## 2013-02-09 ENCOUNTER — Ambulatory Visit
Admission: RE | Admit: 2013-02-09 | Discharge: 2013-02-09 | Disposition: A | Discharge status: Home / Self Care | Payer: Managed Care, Other (non HMO) | Source: Ambulatory Visit | Attending: Otolaryngology | Admitting: Otolaryngology

## 2013-02-09 DIAGNOSIS — H7100 Cholesteatoma of attic, unspecified ear: Secondary | ICD-10-CM

## 2013-02-16 ENCOUNTER — Other Ambulatory Visit: Payer: Self-pay | Admitting: Otolaryngology

## 2013-02-16 DIAGNOSIS — H7100 Cholesteatoma of attic, unspecified ear: Secondary | ICD-10-CM

## 2013-02-16 DIAGNOSIS — H908 Mixed conductive and sensorineural hearing loss, unspecified: Secondary | ICD-10-CM

## 2013-02-16 DIAGNOSIS — Z139 Encounter for screening, unspecified: Secondary | ICD-10-CM

## 2013-02-23 ENCOUNTER — Ambulatory Visit
Admission: RE | Admit: 2013-02-23 | Discharge: 2013-02-23 | Disposition: A | Discharge status: Home / Self Care | Payer: Managed Care, Other (non HMO) | Source: Ambulatory Visit | Attending: Otolaryngology | Admitting: Otolaryngology

## 2013-02-23 DIAGNOSIS — H908 Mixed conductive and sensorineural hearing loss, unspecified: Secondary | ICD-10-CM

## 2013-02-23 DIAGNOSIS — H7100 Cholesteatoma of attic, unspecified ear: Secondary | ICD-10-CM

## 2013-02-23 DIAGNOSIS — Z139 Encounter for screening, unspecified: Secondary | ICD-10-CM

## 2013-02-23 MED ORDER — GADOBENATE DIMEGLUMINE 529 MG/ML IV SOLN
17.0000 mL | Freq: Once | INTRAVENOUS | Status: AC | PRN
  Administered 2013-02-23: 17 mL via INTRAVENOUS

## 2013-03-10 ENCOUNTER — Other Ambulatory Visit (HOSPITAL_COMMUNITY): Payer: Self-pay | Admitting: Otolaryngology

## 2013-03-13 ENCOUNTER — Encounter (HOSPITAL_COMMUNITY): Payer: Self-pay | Admitting: Pharmacy Technician

## 2013-03-15 NOTE — Pre-Procedure Instructions (Signed)
Sean Estes  03/15/2013   Your procedure is scheduled on:  Oct. 15@1pm   Report to [facility]  2 * 3 at 1100 AM.  Call this number if you have problems the morning of surgery: [phone]   Remember:   Do not eat food or drink liquids after midnight.   Take these medicines the morning of surgery with A SIP OF WATER:  Stop taking Aleve, Aspirin, Ibuprofen, BC's, Goody's, Herbal medications,  Fish oil, and Meloxicam (Mobic).   Do not wear jewelry, make-up or nail polish.  Do not wear lotions, powders, or perfumes. You may wear deodorant.  Do not shave 48 hours prior to surgery. Men may shave face and neck.  Do not bring valuables to the hospital.  [facility] is not responsible                  for any belongings or valuables.               Contacts, dentures or bridgework may not be worn into surgery.  Leave suitcase in the car. After surgery it may be brought to your room.  For patients admitted to the hospital, discharge time is determined by your                treatment team.               Patients discharged the day of surgery will not be allowed to drive  home.    Special Instructions: Shower using CHG 2 nights before surgery and the night before surgery.  If you shower the day of surgery use CHG.  Use special wash - you have one bottle of CHG for all showers.  You should use approximately 1/3 of the bottle for each shower.   Please read over the following fact sheets that you were given: Pain Booklet, Coughing and Deep Breathing and Surgical Site Infection Prevention

## 2013-03-16 ENCOUNTER — Encounter (HOSPITAL_COMMUNITY): Payer: Self-pay

## 2013-03-16 ENCOUNTER — Encounter (HOSPITAL_COMMUNITY)
Admission: RE | Admit: 2013-03-16 | Discharge: 2013-03-16 | Disposition: A | Discharge status: Home / Self Care | Payer: Managed Care, Other (non HMO) | Source: Ambulatory Visit | Attending: Otolaryngology | Admitting: Otolaryngology

## 2013-03-16 DIAGNOSIS — Z01818 Encounter for other preprocedural examination: Secondary | ICD-10-CM | POA: Insufficient documentation

## 2013-03-16 DIAGNOSIS — Z01812 Encounter for preprocedural laboratory examination: Secondary | ICD-10-CM | POA: Insufficient documentation

## 2013-03-16 HISTORY — DX: Hematuria, unspecified: R31.9

## 2013-03-16 LAB — BASIC METABOLIC PANEL
BUN: 15 mg/dL (ref 6–23)
CO2: 29 mEq/L (ref 19–32)
Chloride: 103 mEq/L (ref 96–112)
GFR calc Af Amer: >90 mL/min (ref >90)
Potassium: 4.3 mEq/L (ref 3.5–5.1)

## 2013-03-16 LAB — CBC
HCT: 43.5 % (ref 39.0–52.0)
Platelets: 236 10*3/uL (ref 150–400)
RBC: 4.99 MIL/uL (ref 4.22–5.81)
RDW: 12.3 % (ref 11.5–15.5)
WBC: 5.2 10*3/uL (ref 4.0–10.5)

## 2013-03-16 NOTE — Progress Notes (Addendum)
Denies seeing a cardiologist.  PCP is Dr Elvera Lennox with [facility]. Denies a recent CXR, EKG, stress test, card cath, or echo. Voices understanding of  pre-admit instructions

## 2013-03-22 ENCOUNTER — Ambulatory Visit (HOSPITAL_COMMUNITY): Payer: Managed Care, Other (non HMO) | Admitting: Certified Registered"

## 2013-03-22 ENCOUNTER — Encounter (HOSPITAL_COMMUNITY): Payer: Managed Care, Other (non HMO) | Admitting: Certified Registered"

## 2013-03-22 ENCOUNTER — Encounter (HOSPITAL_COMMUNITY)
Admission: RE | Disposition: A | Discharge status: Home / Self Care | Payer: Self-pay | Source: Ambulatory Visit | Attending: Otolaryngology

## 2013-03-22 ENCOUNTER — Encounter (HOSPITAL_COMMUNITY): Payer: Self-pay | Admitting: *Deleted

## 2013-03-22 ENCOUNTER — Observation Stay (HOSPITAL_COMMUNITY)
Admission: RE | Admit: 2013-03-22 | Discharge: 2013-03-23 | Disposition: A | Discharge status: Home / Self Care | Payer: Managed Care, Other (non HMO) | Source: Ambulatory Visit | Attending: Otolaryngology | Admitting: Otolaryngology

## 2013-03-22 DIAGNOSIS — H7102 Cholesteatoma of attic, left ear: Secondary | ICD-10-CM

## 2013-03-22 DIAGNOSIS — M129 Arthropathy, unspecified: Secondary | ICD-10-CM | POA: Insufficient documentation

## 2013-03-22 DIAGNOSIS — H7120 Cholesteatoma of mastoid, unspecified ear: Principal | ICD-10-CM | POA: Insufficient documentation

## 2013-03-22 DIAGNOSIS — Z8546 Personal history of malignant neoplasm of prostate: Secondary | ICD-10-CM | POA: Insufficient documentation

## 2013-03-22 HISTORY — PX: TYMPANOMASTOIDECTOMY: SHX34

## 2013-03-22 SURGERY — TYMPANOPLASTY, WITH MASTOIDECTOMY
Anesthesia: General | Site: Ear | Laterality: Left | Wound class: Clean Contaminated

## 2013-03-22 MED ORDER — ARTIFICIAL TEARS OP OINT
TOPICAL_OINTMENT | OPHTHALMIC | Status: DC | PRN
  Administered 2013-03-22: 1 via OPHTHALMIC

## 2013-03-22 MED ORDER — METHYLENE BLUE 1 % INJ SOLN
INTRAMUSCULAR | Status: AC
  Filled 2013-03-22: qty 10

## 2013-03-22 MED ORDER — MIDAZOLAM HCL 2 MG/2ML IJ SOLN: 1.0000 mg | INTRAMUSCULAR | Status: DC | PRN

## 2013-03-22 MED ORDER — FENTANYL CITRATE 0.05 MG/ML IJ SOLN
INTRAMUSCULAR | Status: DC | PRN
  Administered 2013-03-22 (×5): 50 ug via INTRAVENOUS
  Administered 2013-03-22: 100 ug via INTRAVENOUS

## 2013-03-22 MED ORDER — PROMETHAZINE HCL 25 MG/ML IJ SOLN
INTRAMUSCULAR | Status: AC
  Administered 2013-03-22: 12.5 mg via INTRAVENOUS
  Filled 2013-03-22: qty 1

## 2013-03-22 MED ORDER — PROMETHAZINE HCL 25 MG/ML IJ SOLN: 12.5000 mg | Freq: Four times a day (QID) | INTRAMUSCULAR | Status: DC | PRN

## 2013-03-22 MED ORDER — EPINEPHRINE HCL (NASAL) 0.1 % NA SOLN
NASAL | Status: AC
  Filled 2013-03-22: qty 30

## 2013-03-22 MED ORDER — OXYCODONE HCL 5 MG/5ML PO SOLN: 5.0000 mg | Freq: Once | ORAL | Status: DC | PRN

## 2013-03-22 MED ORDER — FENTANYL CITRATE 0.05 MG/ML IJ SOLN: 50.0000 ug | Freq: Once | INTRAMUSCULAR | Status: DC

## 2013-03-22 MED ORDER — MORPHINE SULFATE 2 MG/ML IJ SOLN: 2.0000 mg | INTRAMUSCULAR | Status: DC | PRN

## 2013-03-22 MED ORDER — DEXAMETHASONE SODIUM PHOSPHATE 4 MG/ML IJ SOLN
INTRAMUSCULAR | Status: DC | PRN
  Administered 2013-03-22: 8 mg via INTRAVENOUS

## 2013-03-22 MED ORDER — ONDANSETRON HCL 4 MG/2ML IJ SOLN
4.0000 mg | Freq: Three times a day (TID) | INTRAMUSCULAR | Status: DC
  Administered 2013-03-22 – 2013-03-23 (×3): 4 mg via INTRAVENOUS
  Filled 2013-03-22 (×3): qty 2

## 2013-03-22 MED ORDER — PROPOFOL 10 MG/ML IV BOLUS
INTRAVENOUS | Status: DC | PRN
  Administered 2013-03-22: 180 mg via INTRAVENOUS

## 2013-03-22 MED ORDER — BACITRACIN ZINC 500 UNIT/GM EX OINT
TOPICAL_OINTMENT | CUTANEOUS | Status: DC | PRN
  Administered 2013-03-22: 1 via TOPICAL

## 2013-03-22 MED ORDER — HEMOSTATIC AGENTS (NO CHARGE) OPTIME
TOPICAL | Status: DC | PRN
  Administered 2013-03-22 (×2): 1 via TOPICAL

## 2013-03-22 MED ORDER — SODIUM CHLORIDE 0.9 % IR SOLN
Status: DC | PRN
  Administered 2013-03-22: 1000 mL

## 2013-03-22 MED ORDER — LACTATED RINGERS IV SOLN
INTRAVENOUS | Status: DC
  Administered 2013-03-22 (×2): via INTRAVENOUS

## 2013-03-22 MED ORDER — OXYCODONE HCL 5 MG PO TABS: 5.0000 mg | ORAL_TABLET | Freq: Once | ORAL | Status: DC | PRN

## 2013-03-22 MED ORDER — MIDAZOLAM HCL 5 MG/5ML IJ SOLN
INTRAMUSCULAR | Status: DC | PRN
  Administered 2013-03-22: 2 mg via INTRAVENOUS

## 2013-03-22 MED ORDER — CIPROFLOXACIN-DEXAMETHASONE 0.3-0.1 % OT SUSP
OTIC | Status: AC
  Filled 2013-03-22: qty 7.5

## 2013-03-22 MED ORDER — CIPROFLOXACIN-DEXAMETHASONE 0.3-0.1 % OT SUSP
OTIC | Status: DC | PRN
  Administered 2013-03-22: 4 [drp] via OTIC

## 2013-03-22 MED ORDER — ONDANSETRON HCL 4 MG/2ML IJ SOLN
INTRAMUSCULAR | Status: DC | PRN
  Administered 2013-03-22 (×2): 4 mg via INTRAMUSCULAR

## 2013-03-22 MED ORDER — SCOPOLAMINE 1 MG/3DAYS TD PT72
1.0000 | MEDICATED_PATCH | Freq: Once | TRANSDERMAL | Status: AC
  Administered 2013-03-22: 1 via TRANSDERMAL
  Filled 2013-03-22: qty 1

## 2013-03-22 MED ORDER — HYDROCODONE-ACETAMINOPHEN 5-325 MG PO TABS: 2.0000 | ORAL_TABLET | Freq: Four times a day (QID) | ORAL | Status: DC | PRN

## 2013-03-22 MED ORDER — SUCCINYLCHOLINE CHLORIDE 20 MG/ML IJ SOLN
INTRAMUSCULAR | Status: DC | PRN
  Administered 2013-03-22: 100 mg via INTRAVENOUS

## 2013-03-22 MED ORDER — LIDOCAINE HCL (CARDIAC) 20 MG/ML IV SOLN
INTRAVENOUS | Status: DC | PRN
  Administered 2013-03-22: 80 mg via INTRAVENOUS

## 2013-03-22 MED ORDER — LIDOCAINE-EPINEPHRINE 1 %-1:100000 IJ SOLN
INTRAMUSCULAR | Status: DC | PRN
  Administered 2013-03-22: 20 mL

## 2013-03-22 MED ORDER — BACITRACIN ZINC 500 UNIT/GM EX OINT
TOPICAL_OINTMENT | CUTANEOUS | Status: AC
  Filled 2013-03-22: qty 15

## 2013-03-22 MED ORDER — PROMETHAZINE HCL 25 MG/ML IJ SOLN
6.2500 mg | INTRAMUSCULAR | Status: AC | PRN
  Administered 2013-03-22: 6.25 mg via INTRAVENOUS
  Administered 2013-03-22: 12.5 mg via INTRAVENOUS
  Administered 2013-03-22: 6.25 mg via INTRAVENOUS

## 2013-03-22 MED ORDER — CEFAZOLIN SODIUM-DEXTROSE 2-3 GM-% IV SOLR
INTRAVENOUS | Status: DC | PRN
  Administered 2013-03-22 (×2): 2 g via INTRAVENOUS

## 2013-03-22 MED ORDER — LIDOCAINE-EPINEPHRINE 1 %-1:100000 IJ SOLN
INTRAMUSCULAR | Status: AC
  Filled 2013-03-22: qty 1

## 2013-03-22 MED ORDER — KCL IN DEXTROSE-NACL 20-5-0.45 MEQ/L-%-% IV SOLN
INTRAVENOUS | Status: DC
  Administered 2013-03-22 – 2013-03-23 (×2): via INTRAVENOUS
  Filled 2013-03-22 (×5): qty 1000

## 2013-03-22 MED ORDER — PROMETHAZINE HCL 25 MG/ML IJ SOLN
INTRAMUSCULAR | Status: AC
  Administered 2013-03-22: 6.25 mg via INTRAVENOUS
  Filled 2013-03-22: qty 1

## 2013-03-22 MED ORDER — PROMETHAZINE HCL 25 MG/ML IJ SOLN
INTRAMUSCULAR | Status: AC
  Filled 2013-03-22: qty 1

## 2013-03-22 MED ORDER — FENTANYL CITRATE 0.05 MG/ML IJ SOLN: 25.0000 ug | INTRAMUSCULAR | Status: DC | PRN

## 2013-03-22 SURGICAL SUPPLY — 68 items
APL SKNCLS STERI-STRIP NONHPOA (GAUZE/BANDAGES/DRESSINGS) ×1
BALL CTTN LRG ABS STRL LF (GAUZE/BANDAGES/DRESSINGS) ×1
BANDAGE GAUZE 4  KLING STR (GAUZE/BANDAGES/DRESSINGS) ×1 IMPLANT
BANDAGE GAUZE ELAST BULKY 4 IN (GAUZE/BANDAGES/DRESSINGS) ×2 IMPLANT
BENZOIN TINCTURE PRP APPL 2/3 (GAUZE/BANDAGES/DRESSINGS) ×2 IMPLANT
BLADE EAR TYMPAN 2.5 60D BEAV (BLADE) ×2 IMPLANT
BLADE EYE SICKLE 84 5 BEAV (BLADE) ×2 IMPLANT
BLADE SURG 10 STRL SS (BLADE) IMPLANT
BLADE SURG ROTATE 9660 (MISCELLANEOUS) ×2 IMPLANT
BUR ROUND CUT 3.2MM (BURR) ×2 IMPLANT
BUR ROUND FLUTED 5 RND (BURR) ×2 IMPLANT
BUR SABER RD CUTTING 3.0 (BURR) ×1 IMPLANT
CANISTER SUCTION 2500CC (MISCELLANEOUS) ×2 IMPLANT
CLEANER TIP ELECTROSURG 2X2 (MISCELLANEOUS) ×2 IMPLANT
CLOTH BEACON ORANGE TIMEOUT ST (SAFETY) ×2 IMPLANT
CORDS BIPOLAR (ELECTRODE) IMPLANT
COTTONBALL LRG STERILE PKG (GAUZE/BANDAGES/DRESSINGS) ×2 IMPLANT
COVER SURGICAL LIGHT HANDLE (MISCELLANEOUS) ×2 IMPLANT
CRADLE DONUT ADULT HEAD (MISCELLANEOUS) IMPLANT
DECANTER SPIKE VIAL GLASS SM (MISCELLANEOUS) ×1 IMPLANT
DRAIN PENROSE 1/4X12 LTX STRL (WOUND CARE) ×2 IMPLANT
DRAPE EENT ADH APERT 15X15 STR (DRAPES) ×2 IMPLANT
DRAPE MICROSCOPE LEICA 54X105 (DRAPE) ×2 IMPLANT
DRAPE PROXIMA HALF (DRAPES) ×1 IMPLANT
DRAPE SURG 17X23 STRL (DRAPES) ×2 IMPLANT
DRESSING TELFA 8X3 (GAUZE/BANDAGES/DRESSINGS) ×2 IMPLANT
DRSG GLASSCOCK MASTOID ADT (GAUZE/BANDAGES/DRESSINGS) IMPLANT
ELECT COATED BLADE 2.86 ST (ELECTRODE) ×2 IMPLANT
ELECT PAIRED SUBDERMAL (MISCELLANEOUS) ×2
ELECT REM PT RETURN 9FT ADLT (ELECTROSURGICAL) ×2
ELECTRODE PAIRED SUBDERMAL (MISCELLANEOUS) ×1 IMPLANT
ELECTRODE REM PT RTRN 9FT ADLT (ELECTROSURGICAL) ×1 IMPLANT
GAUZE PACKING FOLDED 1/2 STR (GAUZE/BANDAGES/DRESSINGS) IMPLANT
GLOVE BIO SURGEON STRL SZ7.5 (GLOVE) ×2 IMPLANT
GLOVE BIOGEL PI IND STRL 8 (GLOVE) IMPLANT
GLOVE BIOGEL PI INDICATOR 8 (GLOVE) ×4
GOWN STRL NON-REIN LRG LVL3 (GOWN DISPOSABLE) ×5 IMPLANT
HEMOSTAT SURGICEL .5X2 ABSORB (HEMOSTASIS) ×1 IMPLANT
KIT BASIN OR (CUSTOM PROCEDURE TRAY) ×2 IMPLANT
KIT ROOM TURNOVER OR (KITS) ×2 IMPLANT
NEEDLE 27GAX1X1/2 (NEEDLE) ×2 IMPLANT
NS IRRIG 1000ML POUR BTL (IV SOLUTION) ×2 IMPLANT
PAD ARMBOARD 7.5X6 YLW CONV (MISCELLANEOUS) ×4 IMPLANT
PATTIES SURGICAL .25X.25 (GAUZE/BANDAGES/DRESSINGS) ×2 IMPLANT
PENCIL BUTTON HOLSTER BLD 10FT (ELECTRODE) ×2 IMPLANT
PROBE NERVBE PRASS .33 (MISCELLANEOUS) ×2 IMPLANT
PUNCH BIOPSY (OTIC EAR SUPPLIES) ×1 IMPLANT
SHEET SIL 040 (INSTRUMENTS) ×1 IMPLANT
SPECIMEN JAR SMALL (MISCELLANEOUS) ×1 IMPLANT
SPONGE GAUZE 4X4 12PLY (GAUZE/BANDAGES/DRESSINGS) ×2 IMPLANT
SPONGE SURGIFOAM ABS GEL 12-7 (HEMOSTASIS) ×3 IMPLANT
STAPLER VISISTAT 35W (STAPLE) IMPLANT
STRIP CLOSURE SKIN 1/2X4 (GAUZE/BANDAGES/DRESSINGS) ×2 IMPLANT
SUT BONE WAX W31G (SUTURE) ×2 IMPLANT
SUT PLAIN 6 0 TG1408 (SUTURE) ×3 IMPLANT
SUT VIC AB 3-0 SH 27 (SUTURE) ×6
SUT VIC AB 3-0 SH 27X BRD (SUTURE) ×4 IMPLANT
SUT VICRYL 4-0 PS2 18IN ABS (SUTURE) IMPLANT
TAPE UMBILICAL 1/8 X36 TWILL (MISCELLANEOUS) ×1 IMPLANT
TAPE UMBILICAL COTTON 1/8X30 (MISCELLANEOUS) ×1 IMPLANT
TOWEL OR 17X24 6PK STRL BLUE (TOWEL DISPOSABLE) ×2 IMPLANT
TOWEL OR 17X26 10 PK STRL BLUE (TOWEL DISPOSABLE) ×2 IMPLANT
TRAY ENT MC OR (CUSTOM PROCEDURE TRAY) ×2 IMPLANT
TRAY FOLEY CATH 14FRSI W/METER (CATHETERS) IMPLANT
TUBING EXTENTION W/L.L. (IV SETS) ×2 IMPLANT
TUBING IRRIGATION (MISCELLANEOUS) ×2 IMPLANT
WATER STERILE IRR 1000ML POUR (IV SOLUTION) ×2 IMPLANT
WIPE INSTRUMENT VISIWIPE 73X73 (MISCELLANEOUS) ×2 IMPLANT

## 2013-03-22 NOTE — Transfer of Care (Signed)
Immediate Anesthesia Transfer of Care Note  Patient: Sean Estes  Procedure(s) Performed: Procedure(s): TYMPANOMASTOIDECTOMY (Left)  Patient Location: PACU  Anesthesia Type:General  Level of Consciousness: sedated and responds to stimulation  Airway & Oxygen Therapy: Patient Spontanous Breathing and Patient connected to nasal cannula oxygen  Post-op Assessment: Report given to PACU RN and Post -op Vital signs reviewed and stable  Post vital signs: Reviewed and stable  Complications: No apparent anesthesia complications

## 2013-03-22 NOTE — Preoperative (Signed)
Beta Blockers   Reason not to administer Beta Blockers:Not Applicable 

## 2013-03-22 NOTE — Brief Op Note (Signed)
03/22/2013  4:32 PM  PATIENT:  Sean Estes  47 y.o. male  PRE-OPERATIVE DIAGNOSIS:  Left ear cholesteatoma  POST-OPERATIVE DIAGNOSIS:  Left ear cholesteatoma  PROCEDURE:  Procedure(s): TYMPANOMASTOIDECTOMY (Left)  SURGEON:  Surgeon(s) and Role:    * Christia Reading, MD - Primary  PHYSICIAN ASSISTANT:   ASSISTANTS: none   ANESTHESIA:   general  EBL:  Total I/O In: 1500 [I.V.:1500] Out: -   BLOOD ADMINISTERED:none  DRAINS: none   LOCAL MEDICATIONS USED:  XYLOCAINE   SPECIMEN:  Source of Specimen:  Left middle ear and mastoid cholesteatoma  DISPOSITION OF SPECIMEN:  PATHOLOGY  COUNTS:  YES  TOURNIQUET:  * No tourniquets in log *  DICTATION: .Other Dictation: Dictation Number [id number]  PLAN OF CARE: Discharge to home after PACU  PATIENT DISPOSITION:  PACU - hemodynamically stable.   Delay start of Pharmacological VTE agent (>24hrs) due to surgical blood loss or risk of bleeding: yes

## 2013-03-22 NOTE — Anesthesia Preprocedure Evaluation (Addendum)
Anesthesia Evaluation  Patient identified by MRN, date of birth, ID band Patient awake    Reviewed: Allergy & Precautions, H&P , NPO status , Patient's Chart, lab work & pertinent test results  History of Anesthesia Complications (+) PONV  Airway Mallampati: II TM Distance: >3 FB Neck ROM: Full    Dental  (+) Teeth Intact and Dental Advisory Given   Pulmonary neg pulmonary ROS,  breath sounds clear to auscultation        Cardiovascular negative cardio ROS  Rhythm:Regular Rate:Normal     Neuro/Psych negative neurological ROS  negative psych ROS   GI/Hepatic negative GI ROS, Neg liver ROS,   Endo/Other  negative endocrine ROS  Renal/GU negative Renal ROS     Musculoskeletal negative musculoskeletal ROS (+)   Abdominal   Peds  Hematology negative hematology ROS (+)   Anesthesia Other Findings Ca prostate  Reproductive/Obstetrics                          Anesthesia Physical Anesthesia Plan  ASA: II  Anesthesia Plan: General   Post-op Pain Management:    Induction: Intravenous  Airway Management Planned: Oral ETT and Video Laryngoscope Planned  Additional Equipment:   Intra-op Plan:   Post-operative Plan: Extubation in OR  Informed Consent: I have reviewed the patients History and Physical, chart, labs and discussed the procedure including the risks, benefits and alternatives for the proposed anesthesia with the patient or authorized representative who has indicated his/her understanding and acceptance.     Plan Discussed with: CRNA and Surgeon  Anesthesia Plan Comments:        Anesthesia Quick Evaluation

## 2013-03-22 NOTE — Anesthesia Procedure Notes (Signed)
Procedure Name: Intubation Date/Time: 03/22/2013 1:17 PM Performed by: Jefm Miles E Pre-anesthesia Checklist: Patient identified, Emergency Drugs available, Suction available, Patient being monitored and Timeout performed Patient Re-evaluated:Patient Re-evaluated prior to inductionOxygen Delivery Method: Circle system utilized Preoxygenation: Pre-oxygenation with 100% oxygen Intubation Type: IV induction Ventilation: Mask ventilation without difficulty Laryngoscope Size: Mac Grade View: Grade I Tube size: 7.5 mm Number of attempts: 1 Airway Equipment and Method: Stylet and Video-laryngoscopy Placement Confirmation: ETT inserted through vocal cords under direct vision,  positive ETCO2 and breath sounds checked- equal and bilateral Secured at: 23 cm Tube secured with: Tape Dental Injury: Teeth and Oropharynx as per pre-operative assessment  Comments: Glidescope used electively due to prominent front teeth that are capped.

## 2013-03-22 NOTE — H&P (Signed)
Sean Estes is an 47 y.o. male.   Chief Complaint: Left middle ear and mastoid cholesteatoma HPI: 47 year old underwent right major ear surgery about 20 years ago for cholesteatoma and noticed left-sided hearing loss about 4-5 months ago.  He was found to have evidence of a left-sided cholesteatoma and associated hearing loss.  He has more recently noticed a salty taste in the left side of his mouth.  He has not experienced vertigo or facial weakness.  CT imaging demonstrated cholesteatoma filling the middle ear and mastoid with eroded ossicles, horizontal canal, and tegmen.  He presents for surgery.  Past Medical History  Diagnosis Date  . History of blood transfusion   . Arthritis   . Cancer     prostate  . PONV (postoperative nausea and vomiting)   . Blood in urine     from prostate surgery    Past Surgical History  Procedure Laterality Date  . Inner ear surgery Right   . Knee surgery Left     ORIF x fracture of 12 bones/plates and screws retained/  LIMITED MOBILITY LEFT LEG  . Back surgery      lumbar/ discectomy  . Eye surgery Right     repair torn retina right with lens implant  . Fracture surgery Left     PATELLA/ LOWER LEG_ LIMITED MOBILITY  . Robot assisted laparoscopic radical prostatectomy N/A 11/02/2012    Procedure: ROBOTIC ASSISTED LAPAROSCOPIC RADICAL PROSTATECTOMY LEVEL 2;  Surgeon: Milford Cage, MD;  Location: [facility];  Service: Urology;  Laterality: N/A;  WITH BILATERAL PELVIC LYMPH NODE DISSECTION   . Lymphadenectomy Bilateral 11/02/2012    Procedure: LYMPHADENECTOMY;  Surgeon: Milford Cage, MD;  Location: [facility];  Service: Urology;  Laterality: Bilateral;  . Tonsillectomy      History reviewed. No pertinent family history. Social History:  reports that he has never smoked. He has never used smokeless tobacco. He reports that he drinks alcohol. He reports that he does not use illicit drugs.  Allergies:  Allergies  Allergen Reactions  .  Codeine     Cant urinate, itching    Medications Prior to Admission  Medication Sig Dispense Refill  . meloxicam (MOBIC) 15 MG tablet Take 15 mg by mouth daily.      . Potassium 99 MG TABS Take 2 tablets by mouth daily.      Marland Kitchen PRESCRIPTION MEDICATION Inject 1 Syringe into the muscle as needed (penile injection for erectile disfunction). Medication:  Trimix        No results found for this or any previous visit (from the past 48 hour(s)). No results found.  Review of Systems  Neurological: Positive for headaches.  All other systems reviewed and are negative.    Blood pressure 134/84, pulse 63, temperature 97.1 F (36.2 C), temperature source Oral, resp. rate 18, height 6' 0.84" (1.85 m), weight 85.3 kg (188 lb 0.8 oz), SpO2 99.00%. Physical Exam  Constitutional: He is oriented to person, place, and time. He appears well-developed and well-nourished. No distress.  HENT:  Head: Normocephalic and atraumatic.  Right Ear: External ear normal.  Left Ear: External ear normal.  Nose: Nose normal.  Mouth/Throat: Oropharynx is clear and moist.  Left attic cholesteatoma.  Eyes: Conjunctivae and EOM are normal. Pupils are equal, round, and reactive to light.  Neck: Normal range of motion. Neck supple.  Cardiovascular: Normal rate.   Respiratory: Effort normal.  GI:  Did not examine.  Genitourinary:  Did not examine.  Musculoskeletal: Normal range of motion.  Neurological: He is alert and oriented to person, place, and time. No cranial nerve deficit.  Skin: Skin is warm and dry.  Psychiatric: He has a normal mood and affect. His behavior is normal. Judgment and thought content normal.     Assessment/Plan Left middle ear/mastoid cholesteatoma To OR for left tympanomastoidectomy, possibly canal-wall-down.  Kai Railsback 03/22/2013, 12:54 PM

## 2013-03-22 NOTE — Progress Notes (Signed)
Report given to Maria RN.

## 2013-03-23 MED ORDER — BACITRACIN-NEOMYCIN-POLYMYXIN 400-5-5000 EX OINT
TOPICAL_OINTMENT | CUTANEOUS | Status: AC
  Administered 2013-03-23: 14:00:00
  Filled 2013-03-23: qty 1

## 2013-03-23 MED ORDER — DIAZEPAM 5 MG PO TABS
5.0000 mg | ORAL_TABLET | Freq: Four times a day (QID) | ORAL | Status: DC | PRN
  Administered 2013-03-23: 5 mg via ORAL
  Filled 2013-03-23: qty 1

## 2013-03-23 MED ORDER — DIAZEPAM 5 MG PO TABS: 5.0000 mg | ORAL_TABLET | Freq: Four times a day (QID) | ORAL | Status: DC | PRN

## 2013-03-23 NOTE — Discharge Planning (Signed)
Patient discharged home in stable condition. Verbalizes understanding of all discharge instructions, including home medications and follow up appointments. 

## 2013-03-23 NOTE — Anesthesia Postprocedure Evaluation (Signed)
  Anesthesia Post-op Note  Patient: Sean Estes  Procedure(s) Performed: Procedure(s): TYMPANOMASTOIDECTOMY (Left)  Patient Location: PACU  Anesthesia Type:General  Level of Consciousness: awake and alert   Airway and Oxygen Therapy: Patient Spontanous Breathing  Post-op Pain: none  Post-op Assessment: Post-op Vital signs reviewed, Patient's Cardiovascular Status Stable, Respiratory Function Stable, Patent Airway, No signs of Nausea or vomiting and Pain level controlled  Post-op Vital Signs: Reviewed and stable  Complications: No apparent anesthesia complications

## 2013-03-23 NOTE — Op Note (Signed)
Sean Estes, Sean Estes NO.:  [id number]  MEDICAL RECORD NO.:  [id number]  LOCATION:  [id number]                        FACILITY:  [facility]  PHYSICIAN:  Antony Contras, MD     DATE OF BIRTH:  [birth year]  DATE OF PROCEDURE:  03/22/2013 DATE OF DISCHARGE:                              OPERATIVE REPORT   PREOPERATIVE DIAGNOSIS:  Left middle ear and mastoid cholesteatoma.  POSTOPERATIVE DIAGNOSIS:  Left middle ear and mastoid cholesteatoma.  PROCEDURE:  Left tympanomastoidectomy  SURGEON:  Antony Contras, MD  ANESTHESIA:  General endotracheal anesthesia.  COMPLICATIONS:  None.  INDICATION:  The patient is a 47 year old male, who underwent right mastoidectomy and tympanoplasty in or about 20 years ago for what sounds like a cholesteatoma.  For the past several months, he has noticed decreased hearing on the left ear and was found to have an attic cholesteatoma in the left side.  This is associated with mixed hearing loss and a salty taste in the left side of his mouth.  CT imaging demonstrated middle ear mastoid involvement with a fairly significant erosion of the tegmen tympanum as well as what appeared to be erosion of the lateral or the horizontal semicircular canal and erosion of the middle ear ossicles.  He presents to the operating room for surgical management.  FINDINGS:  There was cholesteatoma found in the attic, epitympanum, and mastoid antrum. It did not extend very far into the mastoid itself. There was postobstructive fluid in the mastoid.  There was a fairly significant area of the erosion of the tegmen tympani and mastoideum, but dura was intact. There was no spinal fluid leak seen.  The horizontal canal was not found to be eroded and was found to be covered in bone.  The incus was fully eroded and not found in the surgery.  The head of the malleus was somewhat eroded.  The stapes was in the proper position with the head of the stapes being a bit eroded  as well.  The chorda tympani nerve was found to be stretched out over the cholesteatoma and had to be sacrificed for removing the cholesteatoma. The head of the malleus was also removed with cholesteatoma.  DESCRIPTION OF PROCEDURE:  The patient was identified in the holding room.  Informed consent having been obtained including discussion of risks, benefits, alternatives, the patient was brought to the operative suite, put on the operative table in supine position.  Anesthesia was induced, and the patient was intubated by Anesthesia team without difficulty.  The patient continued on intravenous antibiotics during the case.  The eyes taped closed and the bed was turned 90 degrees from anesthesia.  Some of the hair behind the ear was shaved with a electric razor.  The left ear was then prepped and draped in sterile fashion. The postauricular region was then injected with 1% lidocaine with 1:100,000 of epinephrine.  The ear canal was also injected in 4 quadrants under the operating microscope with the same local anesthetic. Under the operating microscope, the ear was inspected.  Radial incisions were made with sickle knives at about 5 o'clock and 11 o'clock, and a posterior circumferential incision was made with  tympanoplasty blade.  A tympanomeatal flap were started to be elevated into the canal a bit. The lateral skin was also back-elevated.  At this point, the postauricular incision was made with a #15 blade scalpel through the skin and extended through the subcutaneous layer and down to the mastoid periosteum using Bovie electrocautery.  The ear was elevated in the same plane using Bovie electrocautery up toward the ear canal.  The temporalis fascia was then cleared off, and a rectangular-shaped graft was then collected using a #15 blade scalpel, a Therapist, nutritional, and scissors.  This was placed in a fascia press and later opened for drying.  Bleeding was controlled with Bovie  electrocautery.  A self- retaining retractor was added and the mastoid periosteum was then incised using Bovie electrocautery in a T-shape over the mastoid cortex. The periosteum was elevated then in anterior, posterior, and superior directions.  Anteriorly, it was extended down into the ear canal until it connected in with the canal incisions.  A 0.25-inch Penrose drain was then pulled through the ear canal and out the mastoid and used then to retract the outer ear anteriorly with a clamp.  Self-retaining retractors were then re-added, holding the periosteum back, and the mastoid cortex were then removed using a #5 cutting bur.  Further dissection was performed down into the mastoid, keeping the posterior canal wall intact and skeletonizing the tegmen mastoideum and sigmoid sinus.  Dissection was then performed further deep down into the mastoid until the cholesteatoma sac was identified in the mastoid antrum region. A #3 cutting bur was then used to further dissect bony septations out of the deeper parts of the mastoid.  The semicircular canals were not skeletonized so much, but identified and found to be covered in bone. The tegmen mastoideum was found to have defects during dissection that were created by the cholesteatoma.  The zygomatic root was opened up with the drill as well, and a diamond bur was then used to remove the bone from around the cholesteatoma sac.  The sac was then removed in a piecemeal fashion using cup forceps by carefully dissecting it from surrounding bone as well as the dura.  This was done up into the epitympanum.  At this point, the tympanomeatal flap was elevated in the ear canal down to the anulus.  At first, thinking I had lifted the anulus, a perforation was made in the posterior tympanic membrane.  At this point, it was quickly identified and the anulus then elevated, and the middle ear was entered.  This was elevated inferiorly first with microdissector  and then turned superiorly to elevate it as well up into the region of the cholesteatoma.  The chorda tympani nerve was found to be stretched over the cholesteatoma.  At first, it was kept intact but required sacrifice in order to clear out the cholesteatoma.  In the middle ear, the cholesteatoma was elevated out off of the stapes and out Of the sinus tympani as well.  This was carefully removed from this region up in a superior direction and in a piecemeal fashion first. It was left at first against the stapes as the bulk of the cholesteatoma was removed.  Continued careful dissection was performed with microdissectors and cup forceps to get the cholesteatoma out in a piecemeal fashion.  This included working through the mastoid antrum as well as through the middle ear.  With fine dissection with a Pollyann Kennedy pick, the cholesteatoma sac was then elevated off the stapes keeping the  stapes in good position and intact.  The stapedial tendon was not identified.  Continued dissection then was done to remove the sac from the facial ridge region where the nerve was likely a bit dehisced since a touch in that area stimulated the nerve integrity monitor.  The nerve was carefully kept from trauma.  With further dissection, it was evident the incus was missing and the head of the malleus needed to be removed in order to get the cholesteatoma from the epitympanum.  Thus, a malleus nipper was then used to remove the head of the malleus.  Further dissection was then performed in the epitympanum both through the mastoid antrum and the middle ear to remove remnants of the remaining pieces of the cholesteatoma until it was fully removed.  The middle ear mastoid was then copiously irrigated with saline.  The fascia graft was then evaluated and cut to fit the defect in the middle ear.  This included an incision at the leading edge of the graft to divide at the tensor tympani tendon.  The graft was then laid into  the middle ear and advanced into the tympanic membrane through the mesotympanum as well as to the epitympanum above the tensor tympani tendon.  Gelfoam soaked in Ciprodex was then laid into the middle ear and advanced anteriorly holding the graft in proper position, both in the mesotympanum and the epitympanum.  The graft and flap were then laid back down and found to be in good position.  Further Gelfoam was placed into the mastoid antrum to support the superior segment of the fascial graft.  At this point, bone pate that had been collected during removal of the mastoid antrum was then spread out over the tegmen defects and covered in Gelfoam soaked in Ciprodex.  The external ear was then released, and mastoid periosteum closed with 3-0 Vicryl suture in a simple interrupted fashion.  The subcutaneous layer of the incision was closed with same.  The skin of the incision was closed with 6-0 plain gut in a simple running fashion. At this point, the ear canal was again inspected through the operating microscope using a speculum.  Gelfoam soaked in Ciprodex was placed over the canal incisions.  At this point, the patient was cleaned off and drapes removed.  A traditional mastoid dressing was applied after placing bacitracin ointment on the postauricular incision and placing an ointment-coated cotton ball on the ear.  The patient was then returned to Anesthesia for wake-up, was extubated in the recovery room in stable condition.  Of note, a nerve integrity monitor was placed at the beginning of surgery prior to prepping and draping, and was removed at the end.  It was used throughout the surgery.     Antony Contras, MD     DDB/MEDQ  D:  03/22/2013  T:  03/23/2013  Job:  [id number]

## 2013-03-23 NOTE — Progress Notes (Signed)
Patient admitted to the floor from PACU.  Patient experiencing nausea and vomiting post-operatively.  Patient is alert and oriented with IV intact and infusing.  Patient oriented to equipment in room and call bell within reach.  Vital signs stable.  Will continue to monitor.

## 2013-03-23 NOTE — Discharge Summary (Signed)
Physician Discharge Summary  Patient ID: Sean Estes MRN: [id number] DOB/AGE: 14-Mar-1966 47 y.o.  Admit date: 03/22/2013 Discharge date: 03/23/2013  Admission Diagnoses: Left middle ear/mastoid cholesteatoma  Discharge Diagnoses: same   Discharged Condition: fair  Hospital Course: 47 year old male underwent left tympanomastoidectomy for left ear cholesteatoma.  See dictated operative note.  After surgery, he had a fair amount of nausea so he was observed overnight.  Nausea gave way to vertigo for which he was given diazepam.  This medication helped the symptom to where he was felt stable for discharge.  Consults: None  Significant Diagnostic Studies: None  Treatments: surgery: Left tympanomastoidectomy  Discharge Exam: Blood pressure 114/61, pulse 69, temperature 97.8 F (36.6 C), temperature source Oral, resp. rate 19, height 6' 0.84" (1.85 m), weight 85.3 kg (188 lb 0.8 oz), SpO2 98.00%. General appearance: alert, cooperative and no distress Ears: mastoid dressing removed.  Mild bloody drainage.  Incision clean and intact.  Normal facial movement.  No spontaneous nystagmus.  Disposition: 01-Home or Self Care  Discharge Orders   Future Orders Complete By Expires   Diet - low sodium heart healthy  As directed    Diet - low sodium heart healthy  As directed    Discharge instructions  As directed    Comments:     Avoid strenuous activity.  Advance diet as tolerated.  Keep head elevated.   Discharge instructions  As directed    Comments:     Avoid strenuous activity.  Apply Bacitracin or Neosporin to the incision behind the ear twice daily.  Change cotton ball coated in the same ointment at least once daily in the ear.  Avoid water in the ear by holding a cup over the ear when bathing.  Advance diet as able.   Increase activity slowly  As directed    Increase activity slowly  As directed        Medication List         diazepam 5 MG tablet  Commonly known as:  VALIUM   Take 1 tablet (5 mg total) by mouth every 6 (six) hours as needed for anxiety.     diazepam 5 MG tablet  Commonly known as:  VALIUM  Take 1 tablet (5 mg total) by mouth every 6 (six) hours as needed for anxiety.     HYDROcodone-acetaminophen 5-325 MG per tablet  Commonly known as:  NORCO/VICODIN  Take 2 tablets by mouth every 6 (six) hours as needed for pain.     meloxicam 15 MG tablet  Commonly known as:  MOBIC  Take 15 mg by mouth daily.     Potassium 99 MG Tabs  Take 2 tablets by mouth daily.     PRESCRIPTION MEDICATION  Inject 1 Syringe into the muscle as needed (penile injection for erectile disfunction). Medication:  Trimix           Follow-up Information   Follow up with Myrtle Barnhard, MD. Schedule an appointment as soon as possible for a visit in 2 weeks.   Specialty:  Otolaryngology   Contact information:   [address] [place] [address] [phone]       Signed: Christia Reading 03/23/2013, 8:47 AM

## 2013-03-27 ENCOUNTER — Encounter (HOSPITAL_COMMUNITY): Payer: Self-pay | Admitting: Otolaryngology

## 2015-01-07 ENCOUNTER — Emergency Department (HOSPITAL_COMMUNITY)
Admission: EM | Admit: 2015-01-07 | Discharge: 2015-01-07 | Disposition: A | Discharge status: Home / Self Care | Payer: Managed Care, Other (non HMO) | Attending: Emergency Medicine | Admitting: Emergency Medicine

## 2015-01-07 ENCOUNTER — Emergency Department (HOSPITAL_COMMUNITY): Payer: Managed Care, Other (non HMO)

## 2015-01-07 ENCOUNTER — Encounter (HOSPITAL_COMMUNITY): Payer: Self-pay

## 2015-01-07 DIAGNOSIS — M1990 Unspecified osteoarthritis, unspecified site: Secondary | ICD-10-CM | POA: Diagnosis not present

## 2015-01-07 DIAGNOSIS — Z79899 Other long term (current) drug therapy: Secondary | ICD-10-CM | POA: Insufficient documentation

## 2015-01-07 DIAGNOSIS — Z8546 Personal history of malignant neoplasm of prostate: Secondary | ICD-10-CM | POA: Diagnosis not present

## 2015-01-07 DIAGNOSIS — N200 Calculus of kidney: Secondary | ICD-10-CM | POA: Insufficient documentation

## 2015-01-07 DIAGNOSIS — R109 Unspecified abdominal pain: Secondary | ICD-10-CM

## 2015-01-07 LAB — CBC
HEMATOCRIT: 49.5 % (ref 39.0–52.0)
HEMOGLOBIN: 16.9 g/dL (ref 13.0–17.0)
MCH: 30.6 pg (ref 26.0–34.0)
MCHC: 34.1 g/dL (ref 30.0–36.0)
MCV: 89.7 fL (ref 78.0–100.0)
Platelets: 258 10*3/uL (ref 150–400)
RBC: 5.52 MIL/uL (ref 4.22–5.81)
RDW: 12.5 % (ref 11.5–15.5)
WBC: 11.7 10*3/uL — ABNORMAL HIGH (ref 4.0–10.5)

## 2015-01-07 LAB — URINALYSIS, ROUTINE W REFLEX MICROSCOPIC
BILIRUBIN URINE: NEGATIVE
Glucose, UA: NEGATIVE mg/dL
Ketones, ur: 15 mg/dL — AB
NITRITE: NEGATIVE
PH: 6 (ref 5.0–8.0)
Protein, ur: 30 mg/dL — AB
SPECIFIC GRAVITY, URINE: 1.031 — AB (ref 1.005–1.030)
Urobilinogen, UA: 1 mg/dL (ref 0.0–1.0)

## 2015-01-07 LAB — COMPREHENSIVE METABOLIC PANEL
ALT: 21 U/L (ref 17–63)
AST: 27 U/L (ref 15–41)
Albumin: 4.4 g/dL (ref 3.5–5.0)
Alkaline Phosphatase: 58 U/L (ref 38–126)
Anion gap: 12 (ref 5–15)
BUN: 18 mg/dL (ref 6–20)
CALCIUM: 9.8 mg/dL (ref 8.9–10.3)
CO2: 26 mmol/L (ref 22–32)
CREATININE: 1.19 mg/dL (ref 0.61–1.24)
Chloride: 103 mmol/L (ref 101–111)
GFR calc Af Amer: >60 mL/min (ref >60)
Glucose, Bld: 147 mg/dL — ABNORMAL HIGH (ref 65–99)
POTASSIUM: 4.2 mmol/L (ref 3.5–5.1)
Sodium: 141 mmol/L (ref 135–145)
Total Bilirubin: 1.1 mg/dL (ref 0.3–1.2)
Total Protein: 7.4 g/dL (ref 6.5–8.1)

## 2015-01-07 LAB — URINE MICROSCOPIC-ADD ON

## 2015-01-07 LAB — LIPASE, BLOOD: Lipase: 37 U/L (ref 22–51)

## 2015-01-07 MED ORDER — KETOROLAC TROMETHAMINE 30 MG/ML IJ SOLN
30.0000 mg | Freq: Once | INTRAMUSCULAR | Status: AC
  Administered 2015-01-07: 30 mg via INTRAVENOUS
  Filled 2015-01-07: qty 1

## 2015-01-07 MED ORDER — SODIUM CHLORIDE 0.9 % IV BOLUS (SEPSIS)
1000.0000 mL | Freq: Once | INTRAVENOUS | Status: AC
  Administered 2015-01-07: 1000 mL via INTRAVENOUS

## 2015-01-07 MED ORDER — TAMSULOSIN HCL 0.4 MG PO CAPS: 0.4000 mg | ORAL_CAPSULE | Freq: Every day | ORAL | Status: DC

## 2015-01-07 MED ORDER — ONDANSETRON HCL 4 MG/2ML IJ SOLN
4.0000 mg | Freq: Once | INTRAMUSCULAR | Status: AC
  Administered 2015-01-07: 4 mg via INTRAVENOUS
  Filled 2015-01-07: qty 2

## 2015-01-07 MED ORDER — HYDROMORPHONE HCL 1 MG/ML IJ SOLN
1.0000 mg | Freq: Once | INTRAMUSCULAR | Status: AC
  Administered 2015-01-07: 1 mg via INTRAVENOUS
  Filled 2015-01-07: qty 1

## 2015-01-07 MED ORDER — TAMSULOSIN HCL 0.4 MG PO CAPS
0.4000 mg | ORAL_CAPSULE | Freq: Once | ORAL | Status: AC
  Administered 2015-01-07: 0.4 mg via ORAL
  Filled 2015-01-07: qty 1

## 2015-01-07 MED ORDER — HYDROCODONE-ACETAMINOPHEN 5-325 MG PO TABS
2.0000 | ORAL_TABLET | Freq: Once | ORAL | Status: AC
  Administered 2015-01-07: 2 via ORAL
  Filled 2015-01-07: qty 2

## 2015-01-07 MED ORDER — FENTANYL CITRATE (PF) 100 MCG/2ML IJ SOLN
50.0000 ug | Freq: Once | INTRAMUSCULAR | Status: AC
  Administered 2015-01-07: 50 ug via INTRAVENOUS
  Filled 2015-01-07: qty 2

## 2015-01-07 MED ORDER — HYDROCODONE-ACETAMINOPHEN 5-325 MG PO TABS: 1.0000 | ORAL_TABLET | Freq: Four times a day (QID) | ORAL | Status: DC | PRN

## 2015-01-07 NOTE — Discharge Instructions (Signed)

## 2015-01-07 NOTE — ED Provider Notes (Signed)
CSN: [id number]     Arrival date & time 01/07/15  0906 History   First MD Initiated Contact with Patient 01/07/15 [phone]     Chief Complaint  Patient presents with  . Flank Pain     (Consider location/radiation/quality/duration/timing/severity/associated sxs/prior Treatment) Patient is a 49 y.o. male presenting with flank pain. The history is provided by the patient.  Flank Pain This is a new problem. The current episode started 3 to 5 hours ago. The problem occurs constantly. The problem has not changed since onset.Pertinent negatives include no abdominal pain and no shortness of breath. Nothing aggravates the symptoms. Nothing relieves the symptoms.    Past Medical History  Diagnosis Date  . History of blood transfusion   . Arthritis   . Cancer     prostate  . PONV (postoperative nausea and vomiting)   . Blood in urine     from prostate surgery   Past Surgical History  Procedure Laterality Date  . Inner ear surgery Right   . Knee surgery Left     ORIF x fracture of 12 bones/plates and screws retained/  LIMITED MOBILITY LEFT LEG  . Back surgery      lumbar/ discectomy  . Eye surgery Right     repair torn retina right with lens implant  . Fracture surgery Left     PATELLA/ LOWER LEG_ LIMITED MOBILITY  . Robot assisted laparoscopic radical prostatectomy N/A 11/02/2012    Procedure: ROBOTIC ASSISTED LAPAROSCOPIC RADICAL PROSTATECTOMY LEVEL 2;  Surgeon: Molli Hazard, MD;  Location: [facility];  Service: Urology;  Laterality: N/A;  WITH BILATERAL PELVIC LYMPH NODE DISSECTION   . Lymphadenectomy Bilateral 11/02/2012    Procedure: LYMPHADENECTOMY;  Surgeon: Molli Hazard, MD;  Location: [facility];  Service: Urology;  Laterality: Bilateral;  . Tonsillectomy    . Tympanomastoidectomy Left 03/22/2013    Procedure: TYMPANOMASTOIDECTOMY;  Surgeon: Melida Quitter, MD;  Location: [facility];  Service: ENT;  Laterality: Left;   No family history on file. History  Substance Use Topics   . Smoking status: Never Smoker   . Smokeless tobacco: Never Used  . Alcohol Use: Yes     Comment: 1-2 shots liquor week    Review of Systems  Constitutional: Negative for fever.  Respiratory: Negative for cough and shortness of breath.   Gastrointestinal: Positive for nausea and vomiting. Negative for abdominal pain.  Genitourinary: Positive for dysuria and flank pain.  All other systems reviewed and are negative.     Allergies  Codeine  Home Medications   Prior to Admission medications   Medication Sig Start Date End Date Taking? Authorizing Provider  diazepam (VALIUM) 5 MG tablet Take 1 tablet (5 mg total) by mouth every 6 (six) hours as needed for anxiety. 03/23/13   Melida Quitter, MD  diazepam (VALIUM) 5 MG tablet Take 1 tablet (5 mg total) by mouth every 6 (six) hours as needed for anxiety. 03/23/13   Melida Quitter, MD  HYDROcodone-acetaminophen (NORCO/VICODIN) 5-325 MG per tablet Take 2 tablets by mouth every 6 (six) hours as needed for pain. 03/22/13   Melida Quitter, MD  meloxicam (MOBIC) 15 MG tablet Take 15 mg by mouth daily.    Historical Provider, MD  Potassium 99 MG TABS Take 2 tablets by mouth daily.    Historical Provider, MD  PRESCRIPTION MEDICATION Inject 1 Syringe into the muscle as needed (penile injection for erectile disfunction). Medication:  Trimix    Historical Provider, MD   BP 124/77 mmHg  Pulse 56  Temp(Src) 98.1 F (36.7 C) (Oral)  Resp 18  Ht 6\' 1"  (1.854 m)  Wt 185 lb (83.915 kg)  BMI 24.41 kg/m2  SpO2 98% Physical Exam  Constitutional: He is oriented to person, place, and time. He appears well-developed and well-nourished. No distress.  HENT:  Head: Normocephalic and atraumatic.  Mouth/Throat: No oropharyngeal exudate.  Eyes: EOM are normal. Pupils are equal, round, and reactive to light.  Neck: Normal range of motion. Neck supple.  Cardiovascular: Normal rate and regular rhythm.  Exam reveals no friction rub.   No murmur  heard. Pulmonary/Chest: Effort normal and breath sounds normal. No respiratory distress. He has no wheezes. He has no rales.  Abdominal: He exhibits no distension. There is tenderness (mild RLQ, R flank). There is no rebound. Hernia confirmed negative in the right inguinal area and confirmed negative in the left inguinal area.  Genitourinary: Right testis shows no mass, no swelling and no tenderness. Left testis shows no mass, no swelling and no tenderness.  Musculoskeletal: Normal range of motion. He exhibits no edema.  Neurological: He is alert and oriented to person, place, and time.  Skin: He is not diaphoretic.  Nursing note and vitals reviewed.   ED Course  Procedures (including critical care time) Labs Review Labs Reviewed  COMPREHENSIVE METABOLIC PANEL - Abnormal; Notable for the following:    Glucose, Bld 147 (*)    All other components within normal limits  CBC - Abnormal; Notable for the following:    WBC 11.7 (*)    All other components within normal limits  URINALYSIS, ROUTINE W REFLEX MICROSCOPIC (NOT AT [facility]) - Abnormal; Notable for the following:    Color, Urine AMBER (*)    Specific Gravity, Urine 1.031 (*)    Hgb urine dipstick LARGE (*)    Ketones, ur 15 (*)    Protein, ur 30 (*)    Leukocytes, UA SMALL (*)    All other components within normal limits  LIPASE, BLOOD  URINE MICROSCOPIC-ADD ON    Imaging Review Ct Renal Stone Study  01/07/2015   CLINICAL DATA:  Right flank pain  EXAM: CT ABDOMEN AND PELVIS WITHOUT CONTRAST  TECHNIQUE: Multidetector CT imaging of the abdomen and pelvis was performed following the standard protocol without oral or intravenous contrast material administration.  COMPARISON:  None.  FINDINGS: There is mild bibasilar lung atelectatic change. Lung bases are otherwise clear. There is a small hiatal hernia.  No focal liver lesions are identified on this noncontrast enhanced study. Gallbladder wall is not thickened. There is no biliary duct  dilatation.  Spleen, pancreas, and adrenals appear normal.  Left kidney shows no evidence of mass or hydronephrosis. There is no left-sided renal or ureteral calculus. On the right, the kidney is edematous with perinephric stranding and mild perinephric fluid. There is severe hydronephrosis. There is no renal mass. There is no intrarenal calculus on the right. There is a 4 x 3 mm calculus at the right ureterovesical junction. No other ureteral calculi are identified.  In the pelvis, the urinary bladder is midline with wall thickness within normal limits. Prostate is absent. Several calculi in the postoperative region are noted. There is no pelvic mass or pelvic fluid collection. Appendix appears unremarkable.  There is no bowel obstruction. No free air or portal venous air. There is no ascites, adenopathy, or abscess in the abdomen or pelvis. The colon is largely decompressed. Relative wall thickening in much of the colon is felt to be  due to the decompression as opposed to inflammation. There is no abdominal aortic aneurysm. There is thoracolumbar levoscoliosis. There is anterior wedging of the L2 vertebral body which does not appear acute. There are no blastic or lytic bone lesions.  IMPRESSION: 4 mm calculus at the right ureterovesical junction causing severe hydronephrosis and mild perinephric fluid on the right.  Relative wall thickening of much of the colon is felt to be due to collapse as opposed to inflammation.  Anterior wedging of the L2 vertebral body, a finding that does not appear acute.  Small hiatal hernia.  No bowel obstruction. No abscess. Appendix region appears normal. Status post prostatectomy.   Electronically Signed   By: Lowella Grip III M.D.   On: 01/07/2015 11:55     EKG Interpretation None      MDM   Final diagnoses:  Right flank pain    49 year old male here with right flank pain. Intermittent, associated nausea and vomiting. Radiates around to the right groin. Here  vitals are stable. Normal GU exam. Doubt torsion. Clinical picture likely kidney stone. Will obtain stone study. CT shows 4 mm stone. No signs of infection on UA. Stable for discharge. Given Urology f/u.  I have reviewed all labs and imaging and considered them in my medical decision making.    Evelina Bucy, MD 01/07/15 1248

## 2015-01-07 NOTE — ED Notes (Signed)
Pt. Presents with severe flank/abd pain starting around 0600 this AM. Pt. With emesis. Pain comes and goes.

## 2015-12-30 ENCOUNTER — Other Ambulatory Visit: Payer: Self-pay | Admitting: Orthopaedic Surgery

## 2015-12-30 ENCOUNTER — Ambulatory Visit
Admission: RE | Admit: 2015-12-30 | Discharge: 2015-12-30 | Disposition: A | Discharge status: Home / Self Care | Payer: Managed Care, Other (non HMO) | Source: Ambulatory Visit | Attending: Orthopaedic Surgery | Admitting: Orthopaedic Surgery

## 2015-12-30 DIAGNOSIS — M25572 Pain in left ankle and joints of left foot: Secondary | ICD-10-CM

## 2016-02-04 DIAGNOSIS — S82872D Displaced pilon fracture of left tibia, subsequent encounter for closed fracture with routine healing: Secondary | ICD-10-CM | POA: Diagnosis not present

## 2016-03-24 DIAGNOSIS — Z029 Encounter for administrative examinations, unspecified: Secondary | ICD-10-CM

## 2016-03-30 DIAGNOSIS — Z029 Encounter for administrative examinations, unspecified: Secondary | ICD-10-CM

## 2016-04-01 ENCOUNTER — Encounter (INDEPENDENT_AMBULATORY_CARE_PROVIDER_SITE_OTHER): Payer: Self-pay | Admitting: Orthopaedic Surgery

## 2016-04-01 ENCOUNTER — Ambulatory Visit (INDEPENDENT_AMBULATORY_CARE_PROVIDER_SITE_OTHER): Payer: BLUE CROSS/BLUE SHIELD

## 2016-04-01 ENCOUNTER — Ambulatory Visit (INDEPENDENT_AMBULATORY_CARE_PROVIDER_SITE_OTHER): Payer: BLUE CROSS/BLUE SHIELD | Admitting: Orthopaedic Surgery

## 2016-04-01 DIAGNOSIS — S82872D Displaced pilon fracture of left tibia, subsequent encounter for closed fracture with routine healing: Secondary | ICD-10-CM

## 2016-04-01 DIAGNOSIS — S82875D Nondisplaced pilon fracture of left tibia, subsequent encounter for closed fracture with routine healing: Secondary | ICD-10-CM

## 2016-04-01 MED ORDER — CELECOXIB 200 MG PO CAPS: 200.0000 mg | ORAL_CAPSULE | Freq: Two times a day (BID) | ORAL | 3 refills | Status: DC | PRN

## 2016-04-01 NOTE — Progress Notes (Signed)
   Office Visit Note   Patient: Sean Estes           Date of Birth: Nov 09, 1965           MRN: [id number] Visit Date: 04/01/2016              Requested by: Baruch Goldmann, PA-C [address] [place], [place] [address] PCP: Baruch Goldmann, PA-C   Assessment & Plan: Visit Diagnoses:  1. Closed displaced pilon fracture of left tibia with routine healing   2. Closed nondisplaced pilon fracture of left tibia with routine healing, subsequent encounter     Plan: increase activities as tolerated; follow-up prn   Follow-Up Instructions: Return if symptoms worsen or fail to improve.   Orders:  Orders Placed This Encounter  Procedures  . XR Ankle Complete Left   Meds ordered this encounter  Medications  . celecoxib (CELEBREX) 200 MG capsule    Sig: Take 1 capsule (200 mg total) by mouth 2 (two) times daily between meals as needed.    Dispense:  60 capsule    Refill:  3      Procedures: No procedures performed   Clinical Data: No additional findings.   Subjective: Chief Complaint  Patient presents with  . Left Ankle - Pain, Follow-up    LT Pilon ankle fracture DOI 12/29/10 Still having quite a bit of swelling Doing well otherwise    HPI  Review of Systems   Objective: Vital Signs: There were no vitals taken for this visit.  Physical Exam  Ortho Exam Still with swelling, but great ROM, minimal pain, NVISpecialty Comments:  No specialty comments available.  Imaging: No results found.   PMFS History: There are no active problems to display for this patient.  Past Medical History:  Diagnosis Date  . Arthritis   . Blood in urine    from prostate surgery  . Cancer [facility])    prostate  . History of blood transfusion   . PONV (postoperative nausea and vomiting)     No family history on file.  Past Surgical History:  Procedure Laterality Date  . BACK SURGERY     lumbar/ discectomy  . EYE SURGERY Right    repair torn retina right with lens  implant  . FRACTURE SURGERY Left    PATELLA/ LOWER LEG_ LIMITED MOBILITY  . INNER EAR SURGERY Right   . KNEE SURGERY Left    ORIF x fracture of 12 bones/plates and screws retained/  LIMITED MOBILITY LEFT LEG  . LYMPHADENECTOMY Bilateral 11/02/2012   Procedure: LYMPHADENECTOMY;  Surgeon: Molli Hazard, MD;  Location: [facility];  Service: Urology;  Laterality: Bilateral;  . ROBOT ASSISTED LAPAROSCOPIC RADICAL PROSTATECTOMY N/A 11/02/2012   Procedure: ROBOTIC ASSISTED LAPAROSCOPIC RADICAL PROSTATECTOMY LEVEL 2;  Surgeon: Molli Hazard, MD;  Location: [facility];  Service: Urology;  Laterality: N/A;  WITH BILATERAL PELVIC LYMPH NODE DISSECTION   . TONSILLECTOMY    . TYMPANOMASTOIDECTOMY Left 03/22/2013   Procedure: TYMPANOMASTOIDECTOMY;  Surgeon: Melida Quitter, MD;  Location: [facility];  Service: ENT;  Laterality: Left;   Social History   Occupational History  . Not on file.   Social History Main Topics  . Smoking status: Never Smoker  . Smokeless tobacco: Never Used  . Alcohol use Yes     Comment: 1-2 shots liquor week  . Drug use: No  . Sexual activity: Not on file

## 2016-04-13 DIAGNOSIS — C61 Malignant neoplasm of prostate: Secondary | ICD-10-CM | POA: Diagnosis not present

## 2016-04-21 DIAGNOSIS — N5201 Erectile dysfunction due to arterial insufficiency: Secondary | ICD-10-CM | POA: Diagnosis not present

## 2016-04-21 DIAGNOSIS — C61 Malignant neoplasm of prostate: Secondary | ICD-10-CM | POA: Diagnosis not present

## 2016-04-21 DIAGNOSIS — N200 Calculus of kidney: Secondary | ICD-10-CM | POA: Diagnosis not present

## 2016-05-05 DIAGNOSIS — H353213 Exudative age-related macular degeneration, right eye, with inactive scar: Secondary | ICD-10-CM | POA: Diagnosis not present

## 2016-05-05 DIAGNOSIS — H353121 Nonexudative age-related macular degeneration, left eye, early dry stage: Secondary | ICD-10-CM | POA: Diagnosis not present

## 2016-05-28 DIAGNOSIS — R05 Cough: Secondary | ICD-10-CM | POA: Diagnosis not present

## 2016-05-28 DIAGNOSIS — J019 Acute sinusitis, unspecified: Secondary | ICD-10-CM | POA: Diagnosis not present

## 2016-10-23 DIAGNOSIS — D485 Neoplasm of uncertain behavior of skin: Secondary | ICD-10-CM | POA: Diagnosis not present

## 2016-10-23 DIAGNOSIS — L255 Unspecified contact dermatitis due to plants, except food: Secondary | ICD-10-CM | POA: Diagnosis not present

## 2016-10-23 DIAGNOSIS — K644 Residual hemorrhoidal skin tags: Secondary | ICD-10-CM | POA: Diagnosis not present

## 2016-11-17 ENCOUNTER — Other Ambulatory Visit (INDEPENDENT_AMBULATORY_CARE_PROVIDER_SITE_OTHER): Payer: Self-pay | Admitting: *Deleted

## 2016-11-17 NOTE — Telephone Encounter (Signed)
Please advise 

## 2016-11-17 NOTE — Telephone Encounter (Signed)
Pt calling asking for Celebrex refill, called into [facility] [address] please, asking for a call when this is done.

## 2016-11-17 NOTE — Telephone Encounter (Signed)
Ok to refill celebrex 200mg  one po daily #30 2 refills

## 2016-11-18 MED ORDER — CELECOXIB 200 MG PO CAPS: 200.0000 mg | ORAL_CAPSULE | Freq: Every day | ORAL | 2 refills | Status: DC

## 2016-11-18 NOTE — Telephone Encounter (Signed)
I called and spoke with patient to advise patient that rx for celebrex was sent into [facility] on [address] this morning.

## 2016-11-23 ENCOUNTER — Other Ambulatory Visit (INDEPENDENT_AMBULATORY_CARE_PROVIDER_SITE_OTHER): Payer: Self-pay | Admitting: Orthopaedic Surgery

## 2016-11-23 NOTE — Telephone Encounter (Signed)
Please advise 

## 2016-12-28 DIAGNOSIS — D225 Melanocytic nevi of trunk: Secondary | ICD-10-CM | POA: Diagnosis not present

## 2016-12-28 DIAGNOSIS — L814 Other melanin hyperpigmentation: Secondary | ICD-10-CM | POA: Diagnosis not present

## 2016-12-28 DIAGNOSIS — L821 Other seborrheic keratosis: Secondary | ICD-10-CM | POA: Diagnosis not present

## 2016-12-28 DIAGNOSIS — L570 Actinic keratosis: Secondary | ICD-10-CM | POA: Diagnosis not present

## 2016-12-28 DIAGNOSIS — D1801 Hemangioma of skin and subcutaneous tissue: Secondary | ICD-10-CM | POA: Diagnosis not present

## 2017-01-04 DIAGNOSIS — R1031 Right lower quadrant pain: Secondary | ICD-10-CM | POA: Diagnosis not present

## 2017-01-04 DIAGNOSIS — R1084 Generalized abdominal pain: Secondary | ICD-10-CM | POA: Diagnosis not present

## 2017-01-04 DIAGNOSIS — N390 Urinary tract infection, site not specified: Secondary | ICD-10-CM | POA: Diagnosis not present

## 2017-01-04 DIAGNOSIS — B958 Unspecified staphylococcus as the cause of diseases classified elsewhere: Secondary | ICD-10-CM | POA: Diagnosis not present

## 2017-01-18 DIAGNOSIS — R1084 Generalized abdominal pain: Secondary | ICD-10-CM | POA: Diagnosis not present

## 2017-02-02 DIAGNOSIS — H353212 Exudative age-related macular degeneration, right eye, with inactive choroidal neovascularization: Secondary | ICD-10-CM | POA: Diagnosis not present

## 2017-02-02 DIAGNOSIS — H33011 Retinal detachment with single break, right eye: Secondary | ICD-10-CM | POA: Diagnosis not present

## 2017-02-02 DIAGNOSIS — H353121 Nonexudative age-related macular degeneration, left eye, early dry stage: Secondary | ICD-10-CM | POA: Diagnosis not present

## 2017-02-17 DIAGNOSIS — Z8669 Personal history of other diseases of the nervous system and sense organs: Secondary | ICD-10-CM | POA: Diagnosis not present

## 2017-02-17 DIAGNOSIS — H6121 Impacted cerumen, right ear: Secondary | ICD-10-CM | POA: Diagnosis not present

## 2017-02-17 DIAGNOSIS — H900 Conductive hearing loss, bilateral: Secondary | ICD-10-CM | POA: Diagnosis not present

## 2017-02-17 DIAGNOSIS — H73891 Other specified disorders of tympanic membrane, right ear: Secondary | ICD-10-CM | POA: Diagnosis not present

## 2017-03-04 DIAGNOSIS — M546 Pain in thoracic spine: Secondary | ICD-10-CM | POA: Diagnosis not present

## 2017-03-04 DIAGNOSIS — M9903 Segmental and somatic dysfunction of lumbar region: Secondary | ICD-10-CM | POA: Diagnosis not present

## 2017-03-04 DIAGNOSIS — M542 Cervicalgia: Secondary | ICD-10-CM | POA: Diagnosis not present

## 2017-03-04 DIAGNOSIS — M545 Low back pain: Secondary | ICD-10-CM | POA: Diagnosis not present

## 2017-03-18 DIAGNOSIS — M546 Pain in thoracic spine: Secondary | ICD-10-CM | POA: Diagnosis not present

## 2017-03-18 DIAGNOSIS — M542 Cervicalgia: Secondary | ICD-10-CM | POA: Diagnosis not present

## 2017-03-18 DIAGNOSIS — M9903 Segmental and somatic dysfunction of lumbar region: Secondary | ICD-10-CM | POA: Diagnosis not present

## 2017-03-18 DIAGNOSIS — M545 Low back pain: Secondary | ICD-10-CM | POA: Diagnosis not present

## 2017-03-26 DIAGNOSIS — N5089 Other specified disorders of the male genital organs: Secondary | ICD-10-CM | POA: Diagnosis not present

## 2017-03-26 DIAGNOSIS — Z1159 Encounter for screening for other viral diseases: Secondary | ICD-10-CM | POA: Diagnosis not present

## 2017-03-26 DIAGNOSIS — Z0000 Encounter for general adult medical examination without abnormal findings: Secondary | ICD-10-CM | POA: Diagnosis not present

## 2017-03-26 DIAGNOSIS — Z13220 Encounter for screening for lipoid disorders: Secondary | ICD-10-CM | POA: Diagnosis not present

## 2017-03-26 DIAGNOSIS — Z23 Encounter for immunization: Secondary | ICD-10-CM | POA: Diagnosis not present

## 2017-03-26 DIAGNOSIS — Z1211 Encounter for screening for malignant neoplasm of colon: Secondary | ICD-10-CM | POA: Diagnosis not present

## 2017-03-31 DIAGNOSIS — M546 Pain in thoracic spine: Secondary | ICD-10-CM | POA: Diagnosis not present

## 2017-03-31 DIAGNOSIS — M542 Cervicalgia: Secondary | ICD-10-CM | POA: Diagnosis not present

## 2017-03-31 DIAGNOSIS — M9903 Segmental and somatic dysfunction of lumbar region: Secondary | ICD-10-CM | POA: Diagnosis not present

## 2017-03-31 DIAGNOSIS — M545 Low back pain: Secondary | ICD-10-CM | POA: Diagnosis not present

## 2017-04-15 DIAGNOSIS — C61 Malignant neoplasm of prostate: Secondary | ICD-10-CM | POA: Diagnosis not present

## 2017-04-20 DIAGNOSIS — C61 Malignant neoplasm of prostate: Secondary | ICD-10-CM | POA: Diagnosis not present

## 2017-04-20 DIAGNOSIS — N393 Stress incontinence (female) (male): Secondary | ICD-10-CM | POA: Diagnosis not present

## 2017-04-20 DIAGNOSIS — N5201 Erectile dysfunction due to arterial insufficiency: Secondary | ICD-10-CM | POA: Diagnosis not present

## 2017-05-06 DIAGNOSIS — M9903 Segmental and somatic dysfunction of lumbar region: Secondary | ICD-10-CM | POA: Diagnosis not present

## 2017-05-06 DIAGNOSIS — M545 Low back pain: Secondary | ICD-10-CM | POA: Diagnosis not present

## 2017-05-06 DIAGNOSIS — M542 Cervicalgia: Secondary | ICD-10-CM | POA: Diagnosis not present

## 2017-05-06 DIAGNOSIS — M9901 Segmental and somatic dysfunction of cervical region: Secondary | ICD-10-CM | POA: Diagnosis not present

## 2017-05-21 DIAGNOSIS — M542 Cervicalgia: Secondary | ICD-10-CM | POA: Diagnosis not present

## 2017-05-21 DIAGNOSIS — M545 Low back pain: Secondary | ICD-10-CM | POA: Diagnosis not present

## 2017-05-21 DIAGNOSIS — M9903 Segmental and somatic dysfunction of lumbar region: Secondary | ICD-10-CM | POA: Diagnosis not present

## 2017-05-21 DIAGNOSIS — M9901 Segmental and somatic dysfunction of cervical region: Secondary | ICD-10-CM | POA: Diagnosis not present

## 2017-06-11 DIAGNOSIS — M9901 Segmental and somatic dysfunction of cervical region: Secondary | ICD-10-CM | POA: Diagnosis not present

## 2017-06-11 DIAGNOSIS — M9903 Segmental and somatic dysfunction of lumbar region: Secondary | ICD-10-CM | POA: Diagnosis not present

## 2017-06-11 DIAGNOSIS — M542 Cervicalgia: Secondary | ICD-10-CM | POA: Diagnosis not present

## 2017-06-11 DIAGNOSIS — M545 Low back pain: Secondary | ICD-10-CM | POA: Diagnosis not present

## 2017-07-02 DIAGNOSIS — M545 Low back pain: Secondary | ICD-10-CM | POA: Diagnosis not present

## 2017-07-02 DIAGNOSIS — M9901 Segmental and somatic dysfunction of cervical region: Secondary | ICD-10-CM | POA: Diagnosis not present

## 2017-07-02 DIAGNOSIS — M542 Cervicalgia: Secondary | ICD-10-CM | POA: Diagnosis not present

## 2017-07-02 DIAGNOSIS — M9903 Segmental and somatic dysfunction of lumbar region: Secondary | ICD-10-CM | POA: Diagnosis not present

## 2017-07-22 DIAGNOSIS — M9901 Segmental and somatic dysfunction of cervical region: Secondary | ICD-10-CM | POA: Diagnosis not present

## 2017-07-22 DIAGNOSIS — M542 Cervicalgia: Secondary | ICD-10-CM | POA: Diagnosis not present

## 2017-07-22 DIAGNOSIS — M0000 Staphylococcal arthritis, unspecified joint: Secondary | ICD-10-CM | POA: Diagnosis not present

## 2017-07-22 DIAGNOSIS — M546 Pain in thoracic spine: Secondary | ICD-10-CM | POA: Diagnosis not present

## 2017-07-30 DIAGNOSIS — R1031 Right lower quadrant pain: Secondary | ICD-10-CM | POA: Diagnosis not present

## 2017-07-30 DIAGNOSIS — R351 Nocturia: Secondary | ICD-10-CM | POA: Diagnosis not present

## 2017-08-04 DIAGNOSIS — H353212 Exudative age-related macular degeneration, right eye, with inactive choroidal neovascularization: Secondary | ICD-10-CM | POA: Diagnosis not present

## 2017-08-04 DIAGNOSIS — H353121 Nonexudative age-related macular degeneration, left eye, early dry stage: Secondary | ICD-10-CM | POA: Diagnosis not present

## 2017-08-04 DIAGNOSIS — H33011 Retinal detachment with single break, right eye: Secondary | ICD-10-CM | POA: Diagnosis not present

## 2017-08-11 DIAGNOSIS — Z961 Presence of intraocular lens: Secondary | ICD-10-CM | POA: Diagnosis not present

## 2017-08-11 DIAGNOSIS — H52202 Unspecified astigmatism, left eye: Secondary | ICD-10-CM | POA: Diagnosis not present

## 2017-08-13 DIAGNOSIS — J019 Acute sinusitis, unspecified: Secondary | ICD-10-CM | POA: Diagnosis not present

## 2017-09-23 DIAGNOSIS — M545 Low back pain: Secondary | ICD-10-CM | POA: Diagnosis not present

## 2017-09-23 DIAGNOSIS — M9901 Segmental and somatic dysfunction of cervical region: Secondary | ICD-10-CM | POA: Diagnosis not present

## 2017-09-23 DIAGNOSIS — M9902 Segmental and somatic dysfunction of thoracic region: Secondary | ICD-10-CM | POA: Diagnosis not present

## 2017-09-23 DIAGNOSIS — M25512 Pain in left shoulder: Secondary | ICD-10-CM | POA: Diagnosis not present

## 2017-10-08 DIAGNOSIS — M545 Low back pain: Secondary | ICD-10-CM | POA: Diagnosis not present

## 2017-10-08 DIAGNOSIS — M9902 Segmental and somatic dysfunction of thoracic region: Secondary | ICD-10-CM | POA: Diagnosis not present

## 2017-10-08 DIAGNOSIS — M9901 Segmental and somatic dysfunction of cervical region: Secondary | ICD-10-CM | POA: Diagnosis not present

## 2017-10-08 DIAGNOSIS — M25512 Pain in left shoulder: Secondary | ICD-10-CM | POA: Diagnosis not present

## 2017-11-04 DIAGNOSIS — M25512 Pain in left shoulder: Secondary | ICD-10-CM | POA: Diagnosis not present

## 2017-11-04 DIAGNOSIS — M545 Low back pain: Secondary | ICD-10-CM | POA: Diagnosis not present

## 2017-11-04 DIAGNOSIS — M9902 Segmental and somatic dysfunction of thoracic region: Secondary | ICD-10-CM | POA: Diagnosis not present

## 2017-11-04 DIAGNOSIS — M9901 Segmental and somatic dysfunction of cervical region: Secondary | ICD-10-CM | POA: Diagnosis not present

## 2018-01-12 DIAGNOSIS — M542 Cervicalgia: Secondary | ICD-10-CM | POA: Diagnosis not present

## 2018-01-12 DIAGNOSIS — M546 Pain in thoracic spine: Secondary | ICD-10-CM | POA: Diagnosis not present

## 2018-01-12 DIAGNOSIS — M545 Low back pain: Secondary | ICD-10-CM | POA: Diagnosis not present

## 2018-01-12 DIAGNOSIS — M9903 Segmental and somatic dysfunction of lumbar region: Secondary | ICD-10-CM | POA: Diagnosis not present

## 2018-01-20 DIAGNOSIS — M542 Cervicalgia: Secondary | ICD-10-CM | POA: Diagnosis not present

## 2018-01-20 DIAGNOSIS — M545 Low back pain: Secondary | ICD-10-CM | POA: Diagnosis not present

## 2018-01-20 DIAGNOSIS — M546 Pain in thoracic spine: Secondary | ICD-10-CM | POA: Diagnosis not present

## 2018-01-20 DIAGNOSIS — M9903 Segmental and somatic dysfunction of lumbar region: Secondary | ICD-10-CM | POA: Diagnosis not present

## 2018-01-20 DIAGNOSIS — R51 Headache: Secondary | ICD-10-CM | POA: Diagnosis not present

## 2018-01-31 DIAGNOSIS — M542 Cervicalgia: Secondary | ICD-10-CM | POA: Diagnosis not present

## 2018-01-31 DIAGNOSIS — M545 Low back pain: Secondary | ICD-10-CM | POA: Diagnosis not present

## 2018-01-31 DIAGNOSIS — M9903 Segmental and somatic dysfunction of lumbar region: Secondary | ICD-10-CM | POA: Diagnosis not present

## 2018-01-31 DIAGNOSIS — M546 Pain in thoracic spine: Secondary | ICD-10-CM | POA: Diagnosis not present

## 2018-02-01 DIAGNOSIS — J0100 Acute maxillary sinusitis, unspecified: Secondary | ICD-10-CM | POA: Diagnosis not present

## 2018-02-15 DIAGNOSIS — H33021 Retinal detachment with multiple breaks, right eye: Secondary | ICD-10-CM | POA: Diagnosis not present

## 2018-02-15 DIAGNOSIS — H353121 Nonexudative age-related macular degeneration, left eye, early dry stage: Secondary | ICD-10-CM | POA: Diagnosis not present

## 2018-02-15 DIAGNOSIS — H353211 Exudative age-related macular degeneration, right eye, with active choroidal neovascularization: Secondary | ICD-10-CM | POA: Diagnosis not present

## 2018-03-10 DIAGNOSIS — H73891 Other specified disorders of tympanic membrane, right ear: Secondary | ICD-10-CM | POA: Diagnosis not present

## 2018-03-10 DIAGNOSIS — J343 Hypertrophy of nasal turbinates: Secondary | ICD-10-CM | POA: Diagnosis not present

## 2018-03-10 DIAGNOSIS — R0989 Other specified symptoms and signs involving the circulatory and respiratory systems: Secondary | ICD-10-CM | POA: Diagnosis not present

## 2018-03-10 DIAGNOSIS — H6121 Impacted cerumen, right ear: Secondary | ICD-10-CM | POA: Diagnosis not present

## 2018-03-29 DIAGNOSIS — R739 Hyperglycemia, unspecified: Secondary | ICD-10-CM | POA: Diagnosis not present

## 2018-03-29 DIAGNOSIS — Z23 Encounter for immunization: Secondary | ICD-10-CM | POA: Diagnosis not present

## 2018-03-29 DIAGNOSIS — Z114 Encounter for screening for human immunodeficiency virus [HIV]: Secondary | ICD-10-CM | POA: Diagnosis not present

## 2018-03-29 DIAGNOSIS — Z0000 Encounter for general adult medical examination without abnormal findings: Secondary | ICD-10-CM | POA: Diagnosis not present

## 2018-03-29 DIAGNOSIS — R252 Cramp and spasm: Secondary | ICD-10-CM | POA: Diagnosis not present

## 2018-04-21 DIAGNOSIS — C61 Malignant neoplasm of prostate: Secondary | ICD-10-CM | POA: Diagnosis not present

## 2018-04-26 DIAGNOSIS — N5201 Erectile dysfunction due to arterial insufficiency: Secondary | ICD-10-CM | POA: Diagnosis not present

## 2018-04-26 DIAGNOSIS — C61 Malignant neoplasm of prostate: Secondary | ICD-10-CM | POA: Diagnosis not present

## 2018-04-26 DIAGNOSIS — N393 Stress incontinence (female) (male): Secondary | ICD-10-CM | POA: Diagnosis not present

## 2018-05-27 DIAGNOSIS — R05 Cough: Secondary | ICD-10-CM | POA: Diagnosis not present

## 2018-05-27 DIAGNOSIS — J019 Acute sinusitis, unspecified: Secondary | ICD-10-CM | POA: Diagnosis not present

## 2018-08-09 DIAGNOSIS — H6121 Impacted cerumen, right ear: Secondary | ICD-10-CM | POA: Diagnosis not present

## 2018-08-09 DIAGNOSIS — J329 Chronic sinusitis, unspecified: Secondary | ICD-10-CM | POA: Diagnosis not present

## 2019-03-23 ENCOUNTER — Other Ambulatory Visit: Payer: Self-pay

## 2019-03-23 ENCOUNTER — Ambulatory Visit: Payer: Self-pay

## 2019-03-23 ENCOUNTER — Ambulatory Visit (INDEPENDENT_AMBULATORY_CARE_PROVIDER_SITE_OTHER): Payer: BC Managed Care – PPO | Admitting: Orthopedic Surgery

## 2019-03-23 DIAGNOSIS — M542 Cervicalgia: Secondary | ICD-10-CM

## 2019-03-23 DIAGNOSIS — R202 Paresthesia of skin: Secondary | ICD-10-CM

## 2019-03-23 DIAGNOSIS — M7542 Impingement syndrome of left shoulder: Secondary | ICD-10-CM | POA: Diagnosis not present

## 2019-03-23 DIAGNOSIS — R200 Anesthesia of skin: Secondary | ICD-10-CM

## 2019-03-24 ENCOUNTER — Encounter: Payer: Self-pay | Admitting: Orthopedic Surgery

## 2019-03-24 DIAGNOSIS — M7542 Impingement syndrome of left shoulder: Secondary | ICD-10-CM

## 2019-03-24 MED ORDER — LIDOCAINE HCL 1 % IJ SOLN
5.0000 mL | INTRAMUSCULAR | Status: AC | PRN
  Administered 2019-03-24: 5 mL

## 2019-03-24 MED ORDER — METHYLPREDNISOLONE ACETATE 40 MG/ML IJ SUSP
40.0000 mg | INTRAMUSCULAR | Status: AC | PRN
Start: 2019-03-24 — End: 2019-03-24
  Administered 2019-03-24: 23:00:00 40 mg via INTRA_ARTICULAR

## 2019-03-24 MED ORDER — CELECOXIB 200 MG PO CAPS: 200.0000 mg | ORAL_CAPSULE | Freq: Two times a day (BID) | ORAL | 1 refills | Status: DC

## 2019-03-24 MED ORDER — BUPIVACAINE HCL 0.5 % IJ SOLN
9.0000 mL | INTRAMUSCULAR | Status: AC | PRN
  Administered 2019-03-24: 23:00:00 9 mL via INTRA_ARTICULAR

## 2019-03-24 NOTE — Progress Notes (Signed)
Office Visit Note   Patient: Sean Estes           Date of Birth: 1965/08/07           MRN: [id number] Visit Date: 03/23/2019 Requested by: Baruch Goldmann, PA-C [address],  [place] [address] PCP: Baruch Goldmann, PA-C  Subjective: Chief Complaint  Patient presents with  . Left Shoulder - Pain  . Neck - Pain    HPI: Sean Estes is a 53 y.o. male who presents to the office complaining of left shoulder pain.  He notes pain for the last 3 to 4 months.  He cannot recall a specific injury.  He localizes pain to the lateral shoulder.  He denies any weakness.  He also notes medial scapular pain.  He reports numbness and tingling in the left hand, and the whole palmar aspect of the left hand for the last 6 months.  He has some mild neck pain but states it is not significant issue.. Denies much in the way of dorsal hand numbness.              ROS:  All systems reviewed are negative as they relate to the chief complaint within the history of present illness.  Patient denies fevers or chills.  Assessment & Plan: Visit Diagnoses:  1. Impingement syndrome of left shoulder   2. Neck pain   3. Numbness and tingling in left hand     Plan: Patient is a 53 year old male who presents with 3 to 75-month history of left shoulder pain.  He has no injury history.  On exam patient has mild grinding with passive range of motion of the left shoulder above 90 degrees of abduction but great rotator cuff strength.  He has some impingement signs on exam as well.  Patient had cervical spine and left shoulder x-rays today in clinic that were reviewed.  X-rays were negative aside from some loss of lordosis of the cervical spine.  Patient has findings in his history and exam that are concerning for shoulder pathology primarily as well as cervical disc pathology.  As a possibility based primarily on the scapular component of his symptoms I think that the shoulder symptoms are more bothersome to  the patient so recommended left shoulder injection in the subacromial space..  Patient agreed with this plan and tolerated the procedure well.  Prescribed Celebrex.  Patient will follow up as needed if his pain does not improve with injection.  Follow-Up Instructions: No follow-ups on file.   Orders:  Orders Placed This Encounter  Procedures  . XR Shoulder Left  . XR Cervical Spine 2 or 3 views   Meds ordered this encounter  Medications  . celecoxib (CELEBREX) 200 MG capsule    Sig: Take 1 capsule (200 mg total) by mouth 2 (two) times daily.    Dispense:  60 capsule    Refill:  1      Procedures: Large Joint Inj: L subacromial bursa on 03/24/2019 10:32 PM Indications: diagnostic evaluation and pain Details: 18 G 1.5 in needle, posterior approach  Arthrogram: No  Medications: 9 mL bupivacaine 0.5 %; 40 mg methylPREDNISolone acetate 40 MG/ML; 5 mL lidocaine 1 % Outcome: tolerated well, no immediate complications Procedure, treatment alternatives, risks and benefits explained, specific risks discussed. Consent was given by the patient. Immediately prior to procedure a time out was called to verify the correct patient, procedure, equipment, support staff and site/side marked as required. Patient was  prepped and draped in the usual sterile fashion.       Clinical Data: No additional findings.  Objective: Vital Signs: There were no vitals taken for this visit.  Physical Exam:  Constitutional: Patient appears well-developed HEENT:  Head: Normocephalic Eyes:EOM are normal Neck: Normal range of motion Cardiovascular: Normal rate Pulmonary/chest: Effort normal Neurologic: Patient is alert Skin: Skin is warm Psychiatric: Patient has normal mood and affect  Ortho Exam:  Left shoulder Exam Able to fully forward flex and abduct shoulder overhead No loss of ER relative to the other shoulder.  Good endpoint with ER No TTP over the [facility] joint or bicipital groove Good  subscapularis, supraspinatus, and infraspinatus strength Anterior grinding of the left shoulder with passive range of motion above 90 degrees of forward flexion and abduction Positive Hawkins and Neer's impingement signs 5/5 grip strength, forearm pronation/supination, and bicep strength  5/5 motor strength of the bilateral grip, finger abduction, pronation/supination, bicep, tricep, deltoid Negative Spurling's exam  Specialty Comments:  No specialty comments available.  Imaging: No results found.   PMFS History: There are no active problems to display for this patient.  Past Medical History:  Diagnosis Date  . Arthritis   . Blood in urine    from prostate surgery  . Cancer [facility])    prostate  . History of blood transfusion   . PONV (postoperative nausea and vomiting)     No family history on file.  Past Surgical History:  Procedure Laterality Date  . BACK SURGERY     lumbar/ discectomy  . EYE SURGERY Right    repair torn retina right with lens implant  . FRACTURE SURGERY Left    PATELLA/ LOWER LEG_ LIMITED MOBILITY  . INNER EAR SURGERY Right   . KNEE SURGERY Left    ORIF x fracture of 12 bones/plates and screws retained/  LIMITED MOBILITY LEFT LEG  . LYMPHADENECTOMY Bilateral 11/02/2012   Procedure: LYMPHADENECTOMY;  Surgeon: Molli Hazard, MD;  Location: [facility];  Service: Urology;  Laterality: Bilateral;  . ROBOT ASSISTED LAPAROSCOPIC RADICAL PROSTATECTOMY N/A 11/02/2012   Procedure: ROBOTIC ASSISTED LAPAROSCOPIC RADICAL PROSTATECTOMY LEVEL 2;  Surgeon: Molli Hazard, MD;  Location: [facility];  Service: Urology;  Laterality: N/A;  WITH BILATERAL PELVIC LYMPH NODE DISSECTION   . TONSILLECTOMY    . TYMPANOMASTOIDECTOMY Left 03/22/2013   Procedure: TYMPANOMASTOIDECTOMY;  Surgeon: Melida Quitter, MD;  Location: [facility] OR;  Service: ENT;  Laterality: Left;   Social History   Occupational History  . Not on file  Tobacco Use  . Smoking status: Never Smoker  .  Smokeless tobacco: Never Used  Substance and Sexual Activity  . Alcohol use: Yes    Comment: 1-2 shots liquor week  . Drug use: No  . Sexual activity: Not on file

## 2019-03-31 DIAGNOSIS — Z23 Encounter for immunization: Secondary | ICD-10-CM | POA: Diagnosis not present

## 2019-03-31 DIAGNOSIS — Z0000 Encounter for general adult medical examination without abnormal findings: Secondary | ICD-10-CM | POA: Diagnosis not present

## 2019-03-31 DIAGNOSIS — Z13220 Encounter for screening for lipoid disorders: Secondary | ICD-10-CM | POA: Diagnosis not present

## 2019-04-04 DIAGNOSIS — R300 Dysuria: Secondary | ICD-10-CM | POA: Diagnosis not present

## 2019-04-04 DIAGNOSIS — L309 Dermatitis, unspecified: Secondary | ICD-10-CM | POA: Diagnosis not present

## 2019-04-04 DIAGNOSIS — N3000 Acute cystitis without hematuria: Secondary | ICD-10-CM | POA: Diagnosis not present

## 2019-04-11 DIAGNOSIS — R300 Dysuria: Secondary | ICD-10-CM | POA: Diagnosis not present

## 2019-04-11 DIAGNOSIS — N5201 Erectile dysfunction due to arterial insufficiency: Secondary | ICD-10-CM | POA: Diagnosis not present

## 2019-04-11 DIAGNOSIS — C61 Malignant neoplasm of prostate: Secondary | ICD-10-CM | POA: Diagnosis not present

## 2019-04-11 DIAGNOSIS — N393 Stress incontinence (female) (male): Secondary | ICD-10-CM | POA: Diagnosis not present

## 2019-04-19 DIAGNOSIS — L814 Other melanin hyperpigmentation: Secondary | ICD-10-CM | POA: Diagnosis not present

## 2019-04-19 DIAGNOSIS — L819 Disorder of pigmentation, unspecified: Secondary | ICD-10-CM | POA: Diagnosis not present

## 2019-04-19 DIAGNOSIS — D1801 Hemangioma of skin and subcutaneous tissue: Secondary | ICD-10-CM | POA: Diagnosis not present

## 2019-04-19 DIAGNOSIS — D229 Melanocytic nevi, unspecified: Secondary | ICD-10-CM | POA: Diagnosis not present

## 2019-07-25 ENCOUNTER — Other Ambulatory Visit: Payer: Self-pay | Admitting: Surgical

## 2019-07-25 NOTE — Telephone Encounter (Signed)
Pls advise. Thanks.  

## 2019-10-25 ENCOUNTER — Other Ambulatory Visit: Payer: Self-pay | Admitting: Surgical

## 2019-10-25 NOTE — Telephone Encounter (Signed)
Please advise 

## 2019-12-20 ENCOUNTER — Other Ambulatory Visit: Payer: Self-pay | Admitting: Surgical

## 2019-12-20 NOTE — Telephone Encounter (Signed)
Needs updated lab work (BMP) to check kidney function before another refill. Last labwork is 2016

## 2019-12-20 NOTE — Telephone Encounter (Signed)
Nurse visit or OV?

## 2019-12-20 NOTE — Telephone Encounter (Signed)
Can you please scheduled for OV with Dr Marlou Sa or Lurena Joiner?

## 2019-12-21 NOTE — Telephone Encounter (Signed)
Spoke with the patient's wife.   She stated that they did not request a refill. She believes the pharmacy has them on some sort of automatic refill program. She expressed understanding that he would simply need to be see again if he wants that medication again in the future

## 2020-05-22 ENCOUNTER — Ambulatory Visit (INDEPENDENT_AMBULATORY_CARE_PROVIDER_SITE_OTHER): Payer: 59 | Admitting: Orthopedic Surgery

## 2020-05-22 ENCOUNTER — Ambulatory Visit (INDEPENDENT_AMBULATORY_CARE_PROVIDER_SITE_OTHER): Payer: 59

## 2020-05-22 DIAGNOSIS — M25521 Pain in right elbow: Secondary | ICD-10-CM

## 2020-05-24 ENCOUNTER — Telehealth: Payer: Self-pay | Admitting: Orthopedic Surgery

## 2020-05-24 NOTE — Telephone Encounter (Signed)
Pt called stating his celebrex rx never made it to his [facility] on [address] and he would like to have this sent in.   [phone]

## 2020-05-24 NOTE — Telephone Encounter (Signed)
Ok for this? 

## 2020-05-25 ENCOUNTER — Other Ambulatory Visit: Payer: Self-pay | Admitting: Surgical

## 2020-05-25 NOTE — Telephone Encounter (Signed)
Needs to get update labs (CMP or BMP) or fax recent labs to [place].  Last labwork in chart from 2016

## 2020-05-26 ENCOUNTER — Encounter: Payer: Self-pay | Admitting: Orthopedic Surgery

## 2020-05-26 MED ORDER — MELOXICAM 15 MG PO TABS: 15.0000 mg | ORAL_TABLET | Freq: Every day | ORAL | 0 refills | Status: DC
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
## 2020-05-27 NOTE — Telephone Encounter (Signed)
I called patient and advised. He states that medication was sent in for him yesterday, Dr. Marlou Sa sent in Meloxicam, and wanted to know if he could take this and try it. I advised ok to try Meloxicam. He is due for physical with his PCP and will inform them of need for labs. He will let us know if the Meloxicam does not work and he would like Celebrex refilled. He is aware we will need lab results prior to refill.

## 2020-06-23 ENCOUNTER — Other Ambulatory Visit: Payer: Self-pay | Admitting: Orthopedic Surgery

## 2020-06-24 NOTE — Telephone Encounter (Signed)
Please advise. Thanks.  

## 2020-07-24 ENCOUNTER — Other Ambulatory Visit: Payer: Self-pay | Admitting: Surgical

## 2020-07-24 NOTE — Telephone Encounter (Signed)
See below

## 2020-08-28 ENCOUNTER — Other Ambulatory Visit: Payer: Self-pay | Admitting: Surgical

## 2020-08-28 NOTE — Telephone Encounter (Signed)
Please advise. Not seen since 05/2020

## 2020-08-28 NOTE — Telephone Encounter (Signed)
Needs recent CMP before refill

## 2021-01-30 ENCOUNTER — Ambulatory Visit (INDEPENDENT_AMBULATORY_CARE_PROVIDER_SITE_OTHER): Payer: 59

## 2021-01-30 ENCOUNTER — Ambulatory Visit (INDEPENDENT_AMBULATORY_CARE_PROVIDER_SITE_OTHER): Payer: 59 | Admitting: Orthopedic Surgery

## 2021-01-30 ENCOUNTER — Other Ambulatory Visit: Payer: Self-pay

## 2021-01-30 DIAGNOSIS — M545 Low back pain, unspecified: Secondary | ICD-10-CM

## 2021-01-30 MED ORDER — CYCLOBENZAPRINE HCL 10 MG PO TABS: 10.0000 mg | ORAL_TABLET | Freq: Two times a day (BID) | ORAL | 0 refills | Status: DC | PRN

## 2021-01-30 MED ORDER — METHYLPREDNISOLONE 4 MG PO TBPK: ORAL_TABLET | ORAL | 0 refills | Status: DC

## 2021-01-31 ENCOUNTER — Encounter: Payer: Self-pay | Admitting: Orthopedic Surgery

## 2021-01-31 NOTE — Progress Notes (Signed)
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  Denies any history of injury but he did do some painting and twisting and relates the onset of his pain to activities with his grandchild.  Hard for him to sleep at night.  Reports left buttock pain but no radiation into the leg.  Has a small amount of gluteal pain.  Takes Tylenol and ibuprofen for the problem.  No problem with coughing and sneezing.  Had herniated disc surgery in 1995.  He is also using heating pad and TENS unit.  Patient also has had some right arm pain.  Today he localizes his pain more on the volar proximal radial side in the region of the biceps tendon attachment site.  Last clinic visit thought that it could be radial tunnel compression.  He has not been doing too much motocross bike riding recently.              ROS: All systems reviewed are negative as they relate to the chief complaint within the history of present illness.  Patient denies  fevers or chills.   Assessment & Plan: Visit Diagnoses:  1. Low back pain without sciatica, unspecified back pain laterality, unspecified chronicity     Plan: Impression is low back pain which is not totally unexpected with his amount of scoliosis and history of surgery.  Plan for that is Medrol Dosepak 6-day course with Flexeril.  If that does not help his symptoms then we will proceed with MRI scanning and injection.  Regarding the arm I think this is likely biceps tendinitis.  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  Looking less like tennis elbow and radial nerve compression at this clinic  visit.  Follow-Up Instructions: Return if symptoms worsen or fail to improve.   Orders:  Orders Placed This Encounter  Procedures   XR Lumbar Spine 2-3 Views   Meds ordered this encounter  Medications   methylPREDNISolone (MEDROL DOSEPAK) 4 MG TBPK tablet    Sig: Take dosepak as directed    Dispense:  21 tablet    Refill:  0   cyclobenzaprine (FLEXERIL) 10 MG tablet    Sig: Take 1 tablet (10 mg total) by mouth 2 (two) times daily as needed for muscle spasms.    Dispense:  30 tablet    Refill:  0      Procedures: No procedures performed   Clinical Data: No additional findings.  Objective: Vital Signs: There were no vitals taken for this visit.  Physical Exam:   Constitutional: Patient appears well-developed HEENT:  Head: Normocephalic Eyes:EOM are normal Neck: Normal range of motion Cardiovascular: Normal rate Pulmonary/chest: Effort normal Neurologic: Patient is alert Skin: Skin is warm Psychiatric: Patient has normal mood and affect   Ortho Exam: Ortho exam demonstrates full active and passive range of motion of the hips and knees.  No nerve root tension signs.  No paresthesias L1 S1 bilaterally.  Pedal pulses palpable.  Has very good AB duction adduction and  hip flexion strength.  Well-healed surgical incision on the back with no trochanteric tenderness.  Right arm exam demonstrates no tenderness over the lateral condyle.  Does have a little bit of pain with resisted supination on the right compared to the left.  No tenderness over the mobile wad with most of his pain does appear to localize to the biceps attachment.  Motor or sensory function to the right hand is intact  Specialty Comments:  No specialty comments available.  Imaging: No results found.   PMFS History: There are no problems to display for this patient.  Past Medical History:  Diagnosis Date   Arthritis    Blood in urine    from prostate surgery   Cancer [facility])    prostate   History of  blood transfusion    PONV (postoperative nausea and vomiting)     History reviewed. No pertinent family history.  Past Surgical History:  Procedure Laterality Date   BACK SURGERY     lumbar/ discectomy   EYE SURGERY Right    repair torn retina right with lens implant   FRACTURE SURGERY Left    PATELLA/ LOWER LEG_ LIMITED MOBILITY   INNER EAR SURGERY Right    KNEE SURGERY Left    ORIF x fracture of 12 bones/plates and screws retained/  LIMITED MOBILITY LEFT LEG   LYMPHADENECTOMY Bilateral 11/02/2012   Procedure: LYMPHADENECTOMY;  Surgeon: Molli Hazard, MD;  Location: [facility];  Service: Urology;  Laterality: Bilateral;   ROBOT ASSISTED LAPAROSCOPIC RADICAL PROSTATECTOMY N/A 11/02/2012   Procedure: ROBOTIC ASSISTED LAPAROSCOPIC RADICAL PROSTATECTOMY LEVEL 2;  Surgeon: Molli Hazard, MD;  Location: [facility];  Service: Urology;  Laterality: N/A;  WITH BILATERAL PELVIC LYMPH NODE DISSECTION    TONSILLECTOMY     TYMPANOMASTOIDECTOMY Left 03/22/2013   Procedure: TYMPANOMASTOIDECTOMY;  Surgeon: Melida Quitter, MD;  Location: [facility] OR;  Service: ENT;  Laterality: Left;   Social History   Occupational History   Not on file  Tobacco Use   Smoking status: Never   Smokeless tobacco: Never  Substance and Sexual Activity   Alcohol use: Yes    Comment: 1-2 shots liquor week   Drug use: No   Sexual activity: Not on file

## 2021-05-16 ENCOUNTER — Ambulatory Visit (INDEPENDENT_AMBULATORY_CARE_PROVIDER_SITE_OTHER): Payer: 59 | Admitting: Orthopedic Surgery

## 2021-05-16 ENCOUNTER — Ambulatory Visit: Payer: Self-pay

## 2021-05-16 ENCOUNTER — Other Ambulatory Visit: Payer: Self-pay

## 2021-05-16 ENCOUNTER — Encounter: Payer: Self-pay | Admitting: Orthopedic Surgery

## 2021-05-16 DIAGNOSIS — M7711 Lateral epicondylitis, right elbow: Secondary | ICD-10-CM

## 2021-05-16 DIAGNOSIS — M25521 Pain in right elbow: Secondary | ICD-10-CM | POA: Diagnosis not present

## 2021-05-16 MED ORDER — BUPIVACAINE HCL 0.5 % IJ SOLN
0.6600 mL | INTRAMUSCULAR | Status: AC | PRN
  Administered 2021-05-16: .66 mL

## 2021-05-16 MED ORDER — METHYLPREDNISOLONE ACETATE 40 MG/ML IJ SUSP
13.3300 mg | INTRAMUSCULAR | Status: AC | PRN
Start: 2021-05-16 — End: 2021-05-16
  Administered 2021-05-16: 13.33 mg

## 2021-05-16 MED ORDER — LIDOCAINE HCL 1 % IJ SOLN
3.0000 mL | INTRAMUSCULAR | Status: AC | PRN
  Administered 2021-05-16: 3 mL

## 2021-05-16 NOTE — Progress Notes (Addendum)
Office Visit Note   Patient: Sean Estes           Date of Birth: 1966/03/25           MRN: [id number] Visit Date: 05/16/2021 Requested by: Baruch Goldmann, PA-C [place],  [place] [address] PCP: Baruch Goldmann, PA-C  Subjective: Chief Complaint  Patient presents with   Right Elbow - Follow-up    HPI: Sean Estes is a 55 year old patient with right arm and elbow pain.  Started 6 to 8 months ago.  Seen at that time and thought he may have a component of radial nerve compression as well.  Taking Tylenol for pain.  Works as a Dealer.  He is concerned that his strength may be decreasing in his dominant right arm.  Hurts him to grip things.  Anti-inflammatories no help.  Denies any neck pain.              ROS: All systems reviewed are negative as they relate to the chief complaint within the history of present illness.  Patient denies  fevers or chills.   Assessment & Plan: Visit Diagnoses:  1. Pain in right elbow     Plan: Impression is right arm and elbow pain.  Today this looks more like lateral epicondylitis and tendinosis.  Discussed with him MRI scanning and/or nerve conduction study to evaluate both pathologies.  Also patient is requesting a cortisone injection.  Discussed with him the data on cortisone injections which is predictable but short-term relief of symptoms.  Patient would like to try a cortisone injection which was performed today under ultrasound guidance.  If that fails to relieve symptoms then we will proceed with MRI imaging and possible nerve conduction studies to evaluate for radial tunnel syndrome.  Follow-Up Instructions: Return if symptoms worsen or fail to improve.   Orders:  Orders Placed This Encounter  Procedures   US Guided Needle Placement - No Linked Charges   No orders of the defined types were placed in this encounter.     Procedures: Hand/UE Inj: R elbow for lateral epicondylitis on 05/16/2021 10:14 PM Indications:  therapeutic Details: 22 G needle, ultrasound-guided lateral approach Medications: 3 mL lidocaine 1 %; 0.66 mL bupivacaine 0.5 %; 13.33 mg methylPREDNISolone acetate 40 MG/ML Outcome: tolerated well, no immediate complications Procedure, treatment alternatives, risks and benefits explained, specific risks discussed. Immediately prior to procedure a time out was called to verify the correct patient, procedure, equipment, support staff and site/side marked as required. Patient was prepped and draped in the usual sterile fashion.      Clinical Data: No additional findings.  Objective: Vital Signs: There were no vitals taken for this visit.  Physical Exam:   Constitutional: Patient appears well-developed HEENT:  Head: Normocephalic Eyes:EOM are normal Neck: Normal range of motion Cardiovascular: Normal rate Pulmonary/chest: Effort normal Neurologic: Patient is alert Skin: Skin is warm Psychiatric: Patient has normal mood and affect   Ortho Exam: Ortho exam demonstrates full active and passive range of motion of the right elbow.  Prior radiographs normal.  Does have significant pain lateralizing to the lateral epicondyle today particularly with wrist extension and finger extension.  Some tenderness but less so within the proximal supinator region.  No real pain with resisted supination today on the right-hand side.  Grip strength also painful in the right less on the left.  Specialty Comments:  No specialty comments available.  Imaging: US Guided Needle Placement - No Linked Charges  Result Date: 05/16/2021 Ultrasound imaging demonstrates needle placement with extravasation of injection into the recess attachment region of the common extensor at the lateral epicondyle.    PMFS History: There are no problems to display for this patient.  Past Medical History:  Diagnosis Date   Arthritis    Blood in urine    from prostate surgery   Cancer [facility])    prostate   History of blood  transfusion    PONV (postoperative nausea and vomiting)     History reviewed. No pertinent family history.  Past Surgical History:  Procedure Laterality Date   BACK SURGERY     lumbar/ discectomy   EYE SURGERY Right    repair torn retina right with lens implant   FRACTURE SURGERY Left    PATELLA/ LOWER LEG_ LIMITED MOBILITY   INNER EAR SURGERY Right    KNEE SURGERY Left    ORIF x fracture of 12 bones/plates and screws retained/  LIMITED MOBILITY LEFT LEG   LYMPHADENECTOMY Bilateral 11/02/2012   Procedure: LYMPHADENECTOMY;  Surgeon: Molli Hazard, MD;  Location: [facility];  Service: Urology;  Laterality: Bilateral;   ROBOT ASSISTED LAPAROSCOPIC RADICAL PROSTATECTOMY N/A 11/02/2012   Procedure: ROBOTIC ASSISTED LAPAROSCOPIC RADICAL PROSTATECTOMY LEVEL 2;  Surgeon: Molli Hazard, MD;  Location: [facility];  Service: Urology;  Laterality: N/A;  WITH BILATERAL PELVIC LYMPH NODE DISSECTION    TONSILLECTOMY     TYMPANOMASTOIDECTOMY Left 03/22/2013   Procedure: TYMPANOMASTOIDECTOMY;  Surgeon: Melida Quitter, MD;  Location: [facility] OR;  Service: ENT;  Laterality: Left;   Social History   Occupational History   Not on file  Tobacco Use   Smoking status: Never   Smokeless tobacco: Never  Substance and Sexual Activity   Alcohol use: Yes    Comment: 1-2 shots liquor week   Drug use: No   Sexual activity: Not on file

## 2021-07-08 ENCOUNTER — Other Ambulatory Visit: Payer: Self-pay

## 2021-07-08 ENCOUNTER — Telehealth: Payer: Self-pay | Admitting: Orthopedic Surgery

## 2021-07-08 DIAGNOSIS — M25521 Pain in right elbow: Secondary | ICD-10-CM

## 2021-07-08 NOTE — Telephone Encounter (Signed)
Patient aware we have sent him for MRI and nerve study

## 2021-07-08 NOTE — Telephone Encounter (Signed)
Pt calling to update Dr. Marlou Sa that he is still in pain and wanted to know what the next steps would be. Pt stated they had dicussed an MRI order but was not sure if that was the next step or if there was another option he wanted to look into first. The best call back number is [phone]

## 2021-07-08 NOTE — Telephone Encounter (Signed)
Yes  thx

## 2021-07-23 ENCOUNTER — Ambulatory Visit (INDEPENDENT_AMBULATORY_CARE_PROVIDER_SITE_OTHER): Payer: 59 | Admitting: Physical Medicine and Rehabilitation

## 2021-07-23 ENCOUNTER — Encounter: Payer: Self-pay | Admitting: Physical Medicine and Rehabilitation

## 2021-07-23 ENCOUNTER — Other Ambulatory Visit: Payer: Self-pay

## 2021-07-23 DIAGNOSIS — R202 Paresthesia of skin: Secondary | ICD-10-CM

## 2021-07-23 NOTE — Progress Notes (Signed)
Pt state right arm weakness and burning. Pt state the burning pain wakes him up at night. 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 Pt state the takes pain meds to help and his is right handed.  Numeric Pain Rating Scale and Functional Assessment Average Pain 3   In the last MONTH (on 0-10 scale) has pain interfered with the following?  1. General activity like being  able to carry out your everyday physical activities such as walking, climbing stairs, carrying groceries, or moving a chair?  Rating(8)

## 2021-07-26 NOTE — Procedures (Signed)
EMG & NCV Findings: Evaluation of the right radial motor nerve showed borderline prolonged distal onset latency (2.7 ms).  The right median (across palm) sensory nerve showed prolonged distal peak latency (Wrist, 3.9 ms).  All remaining nerves (as indicated in the following tables) were within normal limits.    All examined muscles (as indicated in the following table) showed no evidence of electrical instability.    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  This seems to be asymptomatic.    There is no significant electrodiagnostic evidence of any other focal nerve entrapment (radial nerve in particular), brachial plexopathy or cervical radiculopathy.  **This electrodiagnostic study is more specific then sensitive for radial tunnel syndrome and does not rule out the possibility of this condition.  Recommendations: 1.  Follow-up with referring physician. 2.  Continue current management of symptoms.  ___________________________ Laurence Spates FAAPMR Board Certified, American Board of Physical Medicine and Rehabilitation    Nerve Conduction Studies Anti Sensory Summary Table   Stim Site NR Peak (ms) Norm Peak (ms) P-T Amp (V) Norm P-T Amp Site1 Site2 Delta-P (ms) Dist (cm) Vel (m/s) Norm Vel (m/s)  Right Median Acr Palm Anti Sensory (2nd Digit)  30.5C  Wrist    *3.9 <3.6 20.2 >10 Wrist Palm 2.1 0.0    Palm    1.8 <2.0 13.4         Right Radial Anti Sensory (Base 1st Digit)  30.9C  Wrist    2.3 <3.1 25.1  Wrist Base 1st Digit 2.3 0.0    Right Ulnar Anti Sensory (5th Digit)  31.6C  Wrist    3.2 <3.7 22.1 >15.0 Wrist 5th Digit 3.2 14.0 44 >38   Motor Summary Table   Stim Site NR Onset (ms) Norm Onset (ms) O-P Amp (mV) Norm O-P Amp Site1 Site2 Delta-0 (ms) Dist (cm) Vel (m/s) Norm Vel (m/s)  Right Median Motor (Abd Poll Brev)  31.3C  Wrist    4.1 <4.2 7.2 >5 Elbow  Wrist 4.9 26.5 54 >50  Elbow    9.0  6.8         Right Radial Motor (Ext Indicis)  29.5C  8cm    *2.7 <2.5 3.9 >1.7 Up Arm 8cm 4.3 0.0  >60  Up Arm    7.0  4.5         Right Ulnar Motor (Abd Dig Min)  31.6C  Wrist    3.0 <4.2 8.7 >3 B Elbow Wrist 4.1 26.0 63 >53  B Elbow    7.1  8.2  A Elbow B Elbow 1.6 11.0 69 >53  A Elbow    8.7  8.0          EMG   Side Muscle Nerve Root Ins Act Fibs Psw Amp Dur Poly Recrt Int Fraser Din Comment  Right Abd Poll Brev Median C8-T1 Nml Nml Nml Nml Nml 0 Nml Nml   Right 1stDorInt Ulnar C8-T1 Nml Nml Nml Nml Nml 0 Nml Nml   Right ExtIndicis Radial (Post Int) C7-8 Nml Nml Nml Nml Nml 0 Nml Nml   Right ExtDigCom   Nml Nml Nml Nml Nml 0 Nml Nml   Right BrachioRad Radial C5-6 Nml Nml Nml Nml Nml 0 Nml Nml   Right Triceps Radial C6-7-8 Nml Nml Nml Nml Nml 0 Nml Nml   Right Deltoid Axillary C5-6 Nml Nml Nml Nml Nml 0 Nml Nml  Nerve Conduction Studies Anti Sensory Left/Right Comparison   Stim Site L Lat (ms) R Lat (ms) L-R Lat (ms) L Amp (V) R Amp (V) L-R Amp (%) Site1 Site2 L Vel (m/s) R Vel (m/s) L-R Vel (m/s)  Median Acr Palm Anti Sensory (2nd Digit)  30.5C  Wrist  *3.9   20.2  Wrist Palm     Palm  1.8   13.4        Radial Anti Sensory (Base 1st Digit)  30.9C  Wrist  2.3   25.1  Wrist Base 1st Digit     Ulnar Anti Sensory (5th Digit)  31.6C  Wrist  3.2   22.1  Wrist 5th Digit  44    Motor Left/Right Comparison   Stim Site L Lat (ms) R Lat (ms) L-R Lat (ms) L Amp (mV) R Amp (mV) L-R Amp (%) Site1 Site2 L Vel (m/s) R Vel (m/s) L-R Vel (m/s)  Median Motor (Abd Poll Brev)  31.3C  Wrist  4.1   7.2  Elbow Wrist  54   Elbow  9.0   6.8        Radial Motor (Ext Indicis)  29.5C  8cm  *2.7   3.9  Up Arm 8cm     Up Arm  7.0   4.5        Ulnar Motor (Abd Dig Min)  31.6C  Wrist  3.0   8.7  B Elbow Wrist  63   B Elbow  7.1   8.2  A Elbow B Elbow  69   A Elbow  8.7   8.0           Waveforms:

## 2021-07-26 NOTE — Progress Notes (Signed)
Sean Estes - 56 y.o. male MRN [id number]  Date of birth: 09/30/1965  Office Visit Note: Visit Date: 07/23/2021 PCP: Baruch Goldmann, PA-C Referred by: Meredith Pel, MD  Subjective: Chief Complaint  Patient presents with   Right Arm - Weakness, Pain   HPI:  Sean Estes is a 56 y.o. male who comes in today at the request of Dr. Anderson Malta for electrodiagnostic study of the Right upper extremities.  Patient is Right hand dominant.  Patient works as a Dealer and is having a lot of pain particular in the dorsum of the forearm but also weakness of the arm and burning pain.  No real paresthesias or tingling.  No left-sided complaints.  No frank radicular symptoms.  No prior history of electrodiagnostic studies.  Dr. Randel Pigg concern was for radial tunnel syndrome.  ROS Otherwise per HPI.  Assessment & Plan: Visit Diagnoses:    ICD-10-CM   1. Paresthesia of skin  R20.2 NCV with EMG (electromyography)      Plan: Impression: The above electrodiagnostic study is ABNORMAL and reveals evidence of a mild right median nerve entrapment at the wrist affecting sensory components.  This seems to be asymptomatic.    There is no significant electrodiagnostic evidence of any other focal nerve entrapment (radial nerve in particular), brachial plexopathy or cervical radiculopathy.  **This electrodiagnostic study is more specific then sensitive for radial tunnel syndrome and does not rule out the possibility of this condition.  Recommendations: 1.  Follow-up with referring physician. 2.  Continue current management of symptoms.  Meds & Orders: No orders of the defined types were placed in this encounter.   Orders Placed This Encounter  Procedures   NCV with EMG (electromyography)    Follow-up: Return in about 2 weeks (around 08/06/2021) for  Anderson Malta, MD.   Procedures: No procedures performed  EMG & NCV Findings: Evaluation of the right radial motor nerve showed borderline  prolonged distal onset latency (2.7 ms).  The right median (across palm) sensory nerve showed prolonged distal peak latency (Wrist, 3.9 ms).  All remaining nerves (as indicated in the following tables) were within normal limits.    All examined muscles (as indicated in the following table) showed no evidence of electrical instability.    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  This seems to be asymptomatic.    There is no significant electrodiagnostic evidence of any other focal nerve entrapment (radial nerve in particular), brachial plexopathy or cervical radiculopathy.  **This electrodiagnostic study is more specific then sensitive for radial tunnel syndrome and does not rule out the possibility of this condition.  Recommendations: 1.  Follow-up with referring physician. 2.  Continue current management of symptoms.  ___________________________ Laurence Spates FAAPMR Board Certified, American Board of Physical Medicine and Rehabilitation    Nerve Conduction Studies Anti Sensory Summary Table   Stim Site NR Peak (ms) Norm Peak (ms) P-T Amp (V) Norm P-T Amp Site1 Site2 Delta-P (ms) Dist (cm) Vel (m/s) Norm Vel (m/s)  Right Median Acr Palm Anti Sensory (2nd Digit)  30.5C  Wrist    *3.9 <3.6 20.2 >10 Wrist Palm 2.1 0.0    Palm    1.8 <2.0 13.4         Right Radial Anti Sensory (Base 1st Digit)  30.9C  Wrist    2.3 <3.1 25.1  Wrist Base 1st Digit 2.3  0.0    Right Ulnar Anti Sensory (5th Digit)  31.6C  Wrist    3.2 <3.7 22.1 >15.0 Wrist 5th Digit 3.2 14.0 44 >38   Motor Summary Table   Stim Site NR Onset (ms) Norm Onset (ms) O-P Amp (mV) Norm O-P Amp Site1 Site2 Delta-0 (ms) Dist (cm) Vel (m/s) Norm Vel (m/s)  Right Median Motor (Abd Poll Brev)  31.3C  Wrist    4.1 <4.2 7.2 >5 Elbow Wrist 4.9 26.5 54 >50  Elbow    9.0  6.8         Right Radial Motor (Ext Indicis)   29.5C  8cm    *2.7 <2.5 3.9 >1.7 Up Arm 8cm 4.3 0.0  >60  Up Arm    7.0  4.5         Right Ulnar Motor (Abd Dig Min)  31.6C  Wrist    3.0 <4.2 8.7 >3 B Elbow Wrist 4.1 26.0 63 >53  B Elbow    7.1  8.2  A Elbow B Elbow 1.6 11.0 69 >53  A Elbow    8.7  8.0          EMG   Side Muscle Nerve Root Ins Act Fibs Psw Amp Dur Poly Recrt Int Fraser Din Comment  Right Abd Poll Brev Median C8-T1 Nml Nml Nml Nml Nml 0 Nml Nml   Right 1stDorInt Ulnar C8-T1 Nml Nml Nml Nml Nml 0 Nml Nml   Right ExtIndicis Radial (Post Int) C7-8 Nml Nml Nml Nml Nml 0 Nml Nml   Right ExtDigCom   Nml Nml Nml Nml Nml 0 Nml Nml   Right BrachioRad Radial C5-6 Nml Nml Nml Nml Nml 0 Nml Nml   Right Triceps Radial C6-7-8 Nml Nml Nml Nml Nml 0 Nml Nml   Right Deltoid Axillary C5-6 Nml Nml Nml Nml Nml 0 Nml Nml     Nerve Conduction Studies Anti Sensory Left/Right Comparison   Stim Site L Lat (ms) R Lat (ms) L-R Lat (ms) L Amp (V) R Amp (V) L-R Amp (%) Site1 Site2 L Vel (m/s) R Vel (m/s) L-R Vel (m/s)  Median Acr Palm Anti Sensory (2nd Digit)  30.5C  Wrist  *3.9   20.2  Wrist Palm     Palm  1.8   13.4        Radial Anti Sensory (Base 1st Digit)  30.9C  Wrist  2.3   25.1  Wrist Base 1st Digit     Ulnar Anti Sensory (5th Digit)  31.6C  Wrist  3.2   22.1  Wrist 5th Digit  44    Motor Left/Right Comparison   Stim Site L Lat (ms) R Lat (ms) L-R Lat (ms) L Amp (mV) R Amp (mV) L-R Amp (%) Site1 Site2 L Vel (m/s) R Vel (m/s) L-R Vel (m/s)  Median Motor (Abd Poll Brev)  31.3C  Wrist  4.1   7.2  Elbow Wrist  54   Elbow  9.0   6.8        Radial Motor (Ext Indicis)  29.5C  8cm  *2.7   3.9  Up Arm 8cm     Up Arm  7.0   4.5        Ulnar Motor (Abd Dig Min)  31.6C  Wrist  3.0   8.7  B Elbow Wrist  63   B Elbow  7.1   8.2  A Elbow B Elbow  69   A Elbow  8.7   8.0  Waveforms:              Clinical History: No specialty comments available.     Objective:  VS:  HT:     WT:    BMI:      BP:    HR: bpm    TEMP: ( )   RESP:  Physical Exam Musculoskeletal:        General: No tenderness.     Comments: Inspection reveals no atrophy of the bilateral APB or FDI or hand intrinsics. There is no swelling, color changes, allodynia or dystrophic changes. There is 5 out of 5 strength in the bilateral wrist extension, finger abduction and long finger flexion. There is intact sensation to light touch in all dermatomal and peripheral nerve distributions.  There is pain to palpation over the extensor wad on the right.  There is a negative Hoffmann's test bilaterally.  Skin:    General: Skin is warm and dry.     Findings: No erythema or rash.  Neurological:     General: No focal deficit present.     Mental Status: He is alert and oriented to person, place, and time.     Sensory: No sensory deficit.     Motor: No weakness or abnormal muscle tone.     Coordination: Coordination normal.     Gait: Gait normal.  Psychiatric:        Mood and Affect: Mood normal.        Behavior: Behavior normal.        Thought Content: Thought content normal.     Imaging: No results found.

## 2021-08-18 ENCOUNTER — Ambulatory Visit
Admission: RE | Admit: 2021-08-18 | Discharge: 2021-08-18 | Disposition: A | Discharge status: Home / Self Care | Payer: 59 | Source: Ambulatory Visit | Attending: Orthopedic Surgery | Admitting: Orthopedic Surgery

## 2021-08-18 ENCOUNTER — Other Ambulatory Visit: Payer: Self-pay

## 2021-08-18 DIAGNOSIS — M25521 Pain in right elbow: Secondary | ICD-10-CM

## 2021-08-27 ENCOUNTER — Ambulatory Visit (INDEPENDENT_AMBULATORY_CARE_PROVIDER_SITE_OTHER): Payer: 59 | Admitting: Orthopedic Surgery

## 2021-08-27 ENCOUNTER — Other Ambulatory Visit: Payer: Self-pay

## 2021-08-27 DIAGNOSIS — M79602 Pain in left arm: Secondary | ICD-10-CM

## 2021-08-28 ENCOUNTER — Encounter: Payer: Self-pay | Admitting: Orthopedic Surgery

## 2021-08-28 NOTE — Progress Notes (Signed)
? ?Office Visit Note ?  ?Patient: Sean Estes           ?Date of Birth: Aug 02, 1965           ?MRN: [id number] ?Visit Date: 08/27/2021 ?Requested by: Baruch Goldmann, PA-C ?[address] ?[address] ?[place],  [place] [address] ?PCP: Baruch Goldmann, PA-C ? ?Subjective: ?Chief Complaint  ?Patient presents with  ? Other  ?  Scan review  ? ? ?HPI: Sean Estes is a 56 year old patient here to review scan of right elbow.  Reports mild tenderness on the medial and lateral side but most of his tenderness is around the radial tunnel region.  Has a little bit of volar forearm numbness and tingling which localizes to the lateral antebrachial cutaneous nerve distribution.  Has carpal tunnel syndrome symptoms in the left hand.  He also has some low back pain and known history of arthritis in that region.  Denies any neck pain regarding his upper extremities.  MRI scan of the elbow shows mild tendinitis but no structural problem with the biceps tendon or arthritis in the elbow joint. ?             ?ROS: All systems reviewed are negative as they relate to the chief complaint within the history of present illness.  Patient denies  fevers or chills. ? ? ?Assessment & Plan: ?Visit Diagnoses:  ?1. Left arm pain   ? ? ?Plan: Impression is right elbow mild tendinosis.  I think this could be some nerve conduction level radial nerve compression versus lateral antebrachial cutaneous nerve compression around the biceps tendon.  Next step in management of that would be diagnostic injection around the biceps tendon in the antecubital fossa region.  He also is having left hand carpal tunnel syndrome symptoms.  Numbness and tingling digits 1 through 3 with some loss of grip strength.  He does ride motorcycles and motocross.  Plan nerve conduction study left upper extremity to evaluate carpal tunnel syndrome.  Has mild carpal tunnel on the right.  Follow-up after that study. ? ?Follow-Up Instructions: No follow-ups on file.  ? ?Orders:  ?Orders Placed  This Encounter  ?Procedures  ? Ambulatory referral to Physical Medicine Rehab  ? ?No orders of the defined types were placed in this encounter. ? ? ? ? Procedures: ?No procedures performed ? ? ?Clinical Data: ?No additional findings. ? ?Objective: ?Vital Signs: There were no vitals taken for this visit. ? ?Physical Exam:  ? ?Constitutional: Patient appears well-developed ?HEENT:  ?Head: Normocephalic ?Eyes:EOM are normal ?Neck: Normal range of motion ?Cardiovascular: Normal rate ?Pulmonary/chest: Effort normal ?Neurologic: Patient is alert ?Skin: Skin is warm ?Psychiatric: Patient has normal mood and affect ? ? ?Ortho Exam: Ortho exam demonstrates full active and passive range of motion of both elbows.  He does have some tenderness and paresthesias in the radial proximal forearm region.  No pain with resisted supination or pronation today on the right.  EPL FPL interosseous strength intact.  No other masses lymphadenopathy or skin changes noted in that right elbow region.  Negative Tinel's cubital tunnel.  Does have positive carpal tunnel compression testing on the left.  EPL FPL interosseous strength intact on that side.  No nerve root tension signs today in the back. ? ?Specialty Comments:  ?No specialty comments available. ? ?Imaging: ?No results found. ? ? ?PMFS History: ?There are no problems to display for this patient. ? ?Past Medical History:  ?Diagnosis Date  ? Arthritis   ? Blood in urine   ?  from prostate surgery  ? Cancer [facility])   ? prostate  ? History of blood transfusion   ? PONV (postoperative nausea and vomiting)   ?  ?History reviewed. No pertinent family history.  ?Past Surgical History:  ?Procedure Laterality Date  ? BACK SURGERY    ? lumbar/ discectomy  ? EYE SURGERY Right   ? repair torn retina right with lens implant  ? FRACTURE SURGERY Left   ? PATELLA/ LOWER LEG_ LIMITED MOBILITY  ? INNER EAR SURGERY Right   ? KNEE SURGERY Left   ? ORIF x fracture of 12 bones/plates and screws retained/   LIMITED MOBILITY LEFT LEG  ? LYMPHADENECTOMY Bilateral 11/02/2012  ? Procedure: LYMPHADENECTOMY;  Surgeon: Molli Hazard, MD;  Location: [facility];  Service: Urology;  Laterality: Bilateral;  ? ROBOT ASSISTED LAPAROSCOPIC RADICAL PROSTATECTOMY N/A 11/02/2012  ? Procedure: ROBOTIC ASSISTED LAPAROSCOPIC RADICAL PROSTATECTOMY LEVEL 2;  Surgeon: Molli Hazard, MD;  Location: [facility];  Service: Urology;  Laterality: N/A;  WITH BILATERAL PELVIC LYMPH NODE DISSECTION ?  ? TONSILLECTOMY    ? TYMPANOMASTOIDECTOMY Left 03/22/2013  ? Procedure: TYMPANOMASTOIDECTOMY;  Surgeon: Melida Quitter, MD;  Location: [facility];  Service: ENT;  Laterality: Left;  ? ?Social History  ? ?Occupational History  ? Not on file  ?Tobacco Use  ? Smoking status: Never  ? Smokeless tobacco: Never  ?Substance and Sexual Activity  ? Alcohol use: Yes  ?  Comment: 1-2 shots liquor week  ? Drug use: No  ? Sexual activity: Not on file  ? ? ? ? ? ?

## 2022-01-27 DIAGNOSIS — Z13220 Encounter for screening for lipoid disorders: Secondary | ICD-10-CM | POA: Diagnosis not present

## 2022-01-27 DIAGNOSIS — Z0000 Encounter for general adult medical examination without abnormal findings: Secondary | ICD-10-CM | POA: Diagnosis not present

## 2022-01-30 DIAGNOSIS — Z1211 Encounter for screening for malignant neoplasm of colon: Secondary | ICD-10-CM | POA: Diagnosis not present

## 2022-04-25 IMAGING — MR MR ELBOW*R* W/O CM
4 of 5 series · 13 of 40 positions shown · non-contrast
Comparison: Radiographs 05/22/2020.

CLINICAL DATA: Chronic elbow pain, bone abnormality suspected. Pain
for 8 months. No known injury or prior relevant surgery.

EXAM:
MRI OF THE RIGHT ELBOW WITHOUT CONTRAST
TECHNIQUE: Multiplanar, multisequence MR imaging of the elbow was performed. No
intravenous contrast was administered.

[Series 3: T1 · axial · right · 3.0mm · 0.16mm/px · z∈[-76,+6]mm · 3 of 30 slices shown]
[im 4/30]
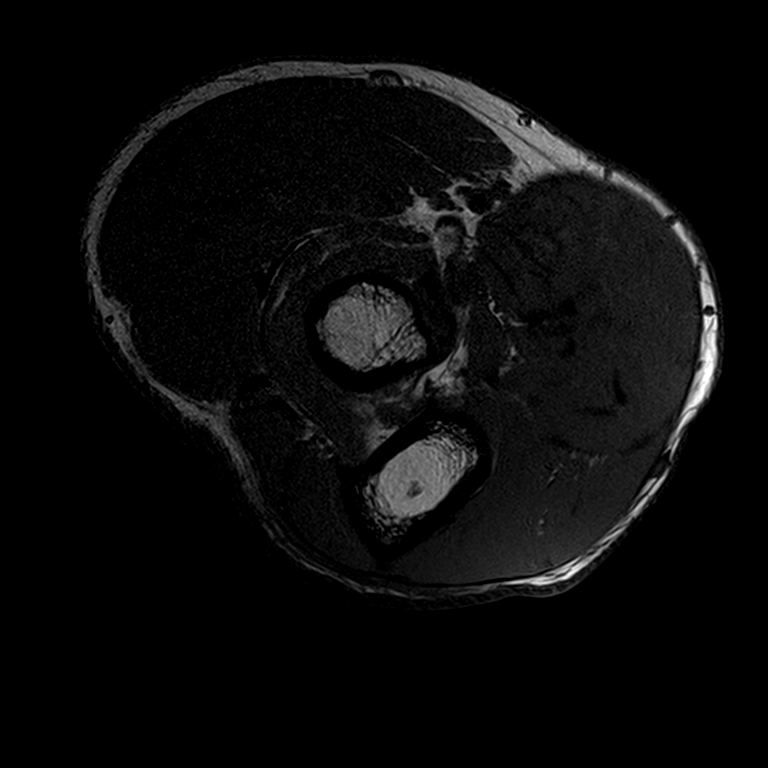
[im 15/30]
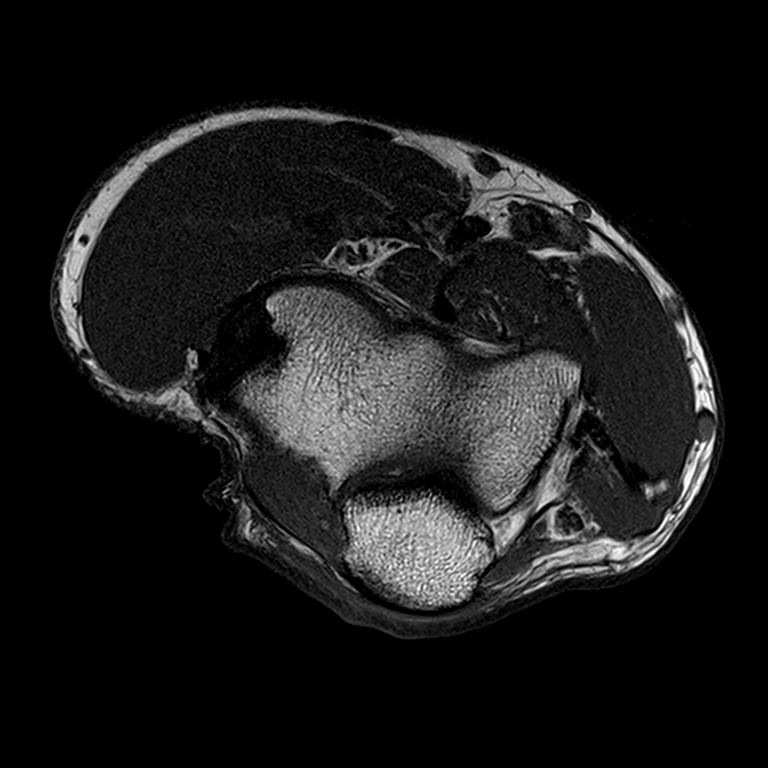
[im 26/30]
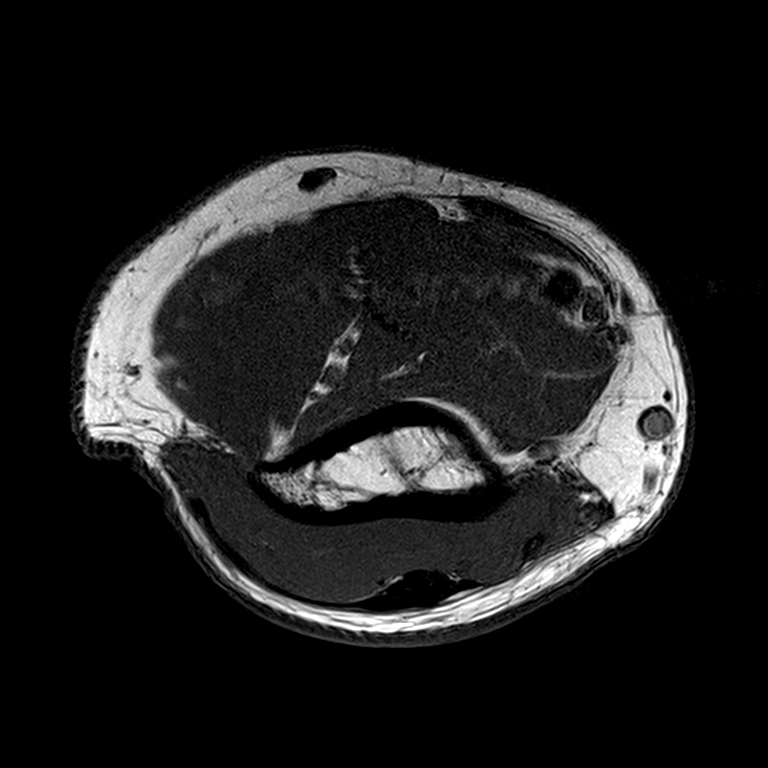

[Series 4: T2 fat-sat · axial · right · 3.0mm · 0.19mm/px · z∈[-84,+9]mm · 4 of 30 slices shown (1 of 3)]
[im 1/30]
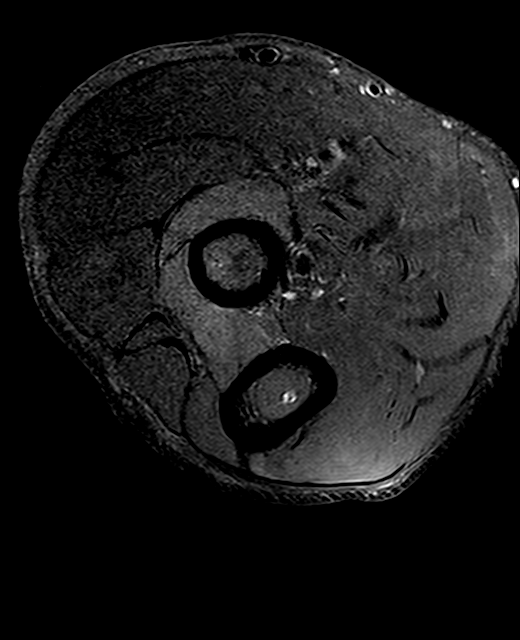
[im 4/30]
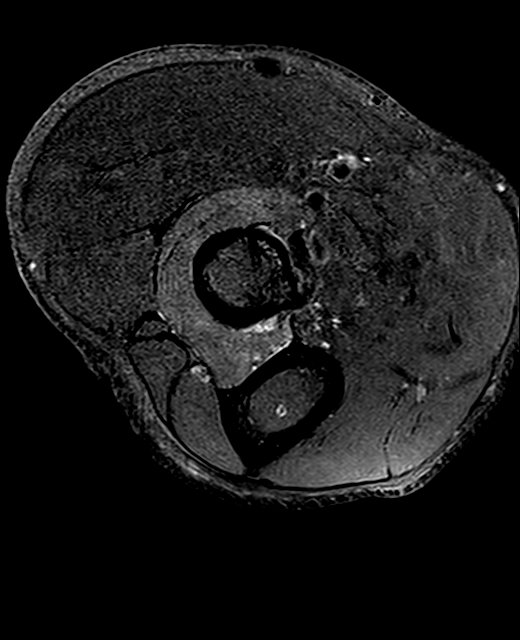
[im 17/30]
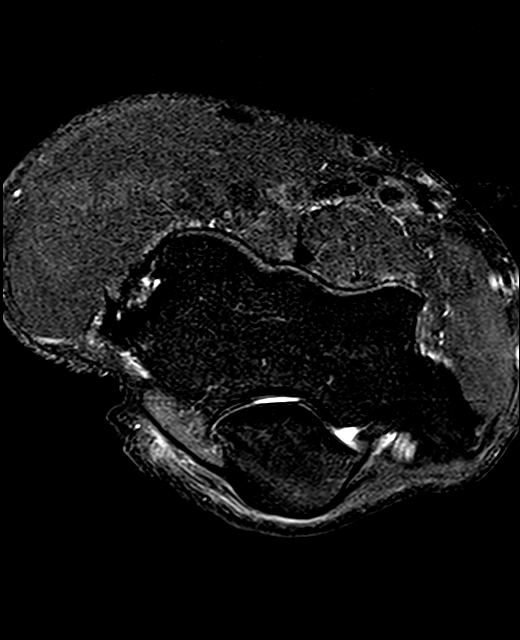
[im 26/30]
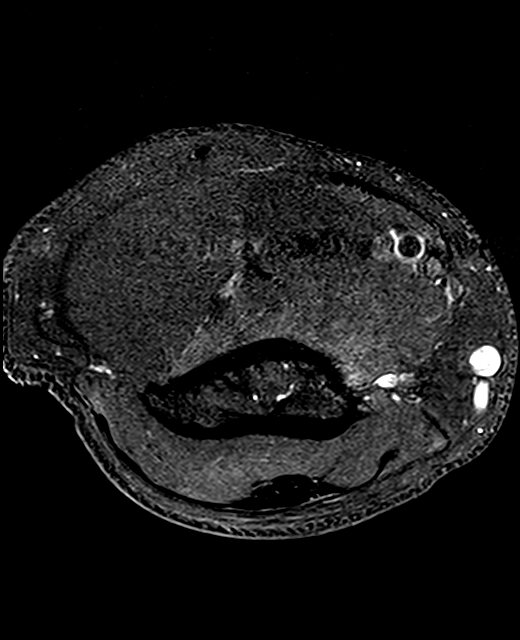

[Series 5: T2 fat-sat · coronal · right · 3.0mm · 0.22mm/px · 3 of 21 slices shown (2 of 3)]
[im 4/21]
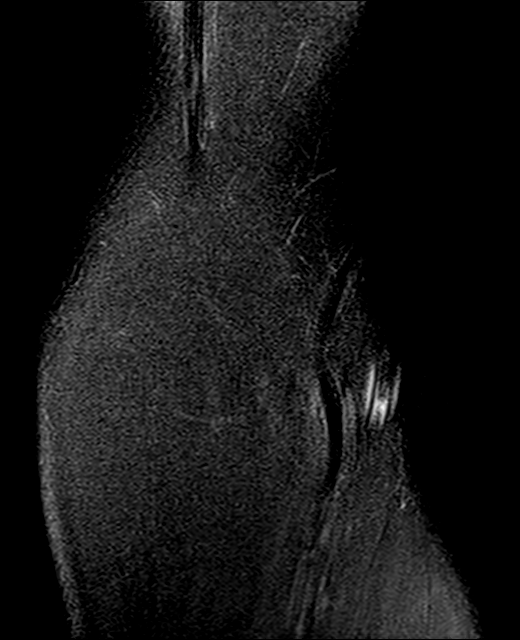
[im 11/21]
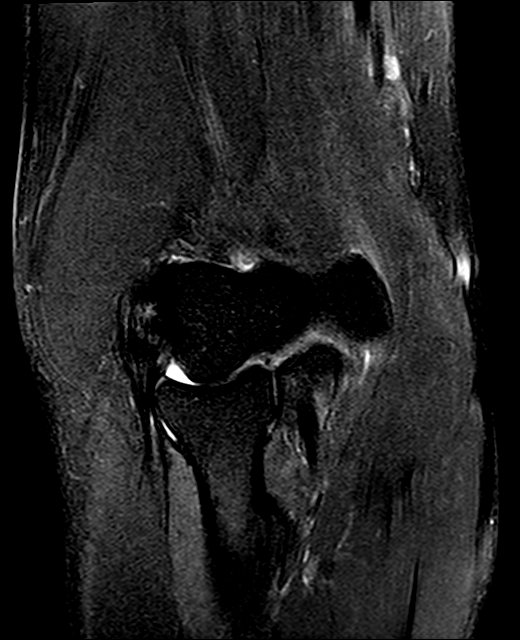
[im 17/21]
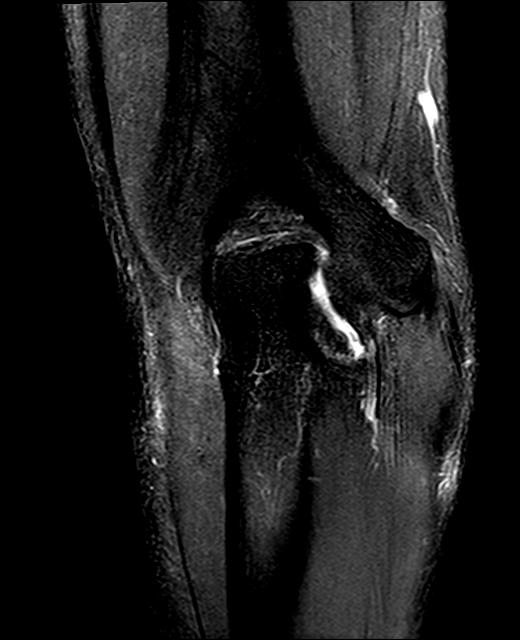

[Series 7: T2 fat-sat · sagittal · right · 3.0mm · 0.22mm/px · 3 of 23 slices shown (3 of 3)]
[im 4/23]
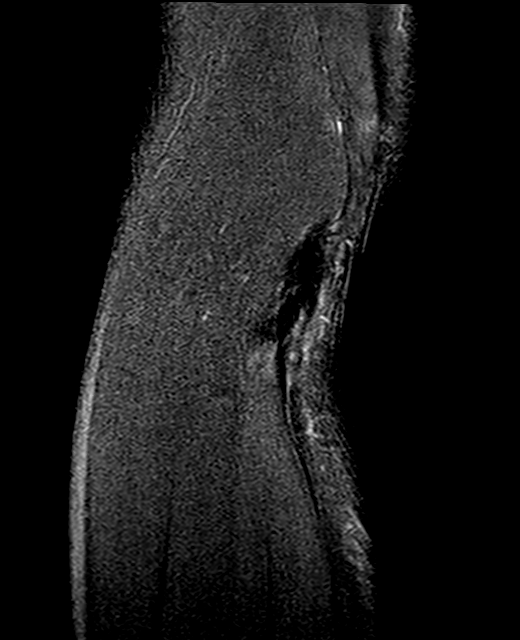
[im 12/23]
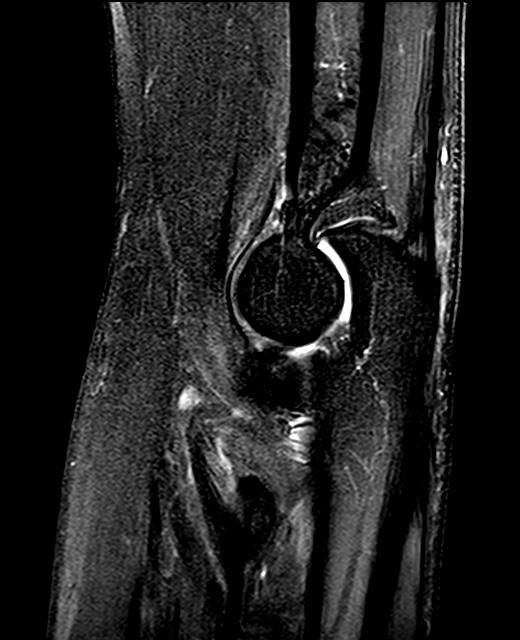
[im 19/23]
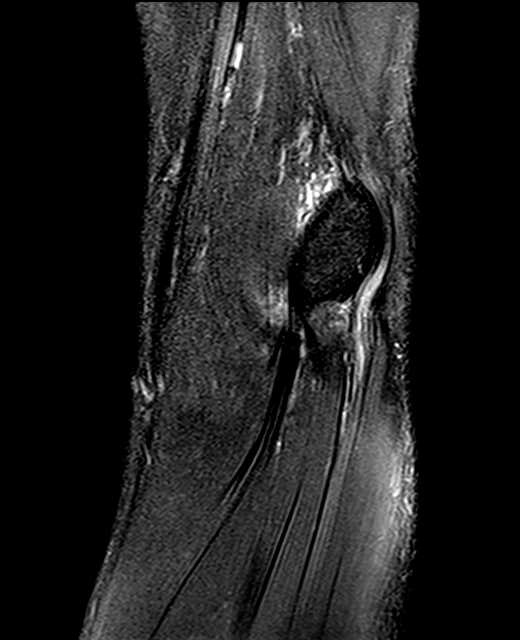

[13 of 40 positions shown; findings below may reference images not displayed]

FINDINGS: TENDONS

Common forearm flexor origin: Intact with mild tendinosis.

Common forearm extensor origin: Mild tendinosis and partial
intrasubstance tearing. No full-thickness tendon tear or tendon
retraction.

Biceps: Intact.

Triceps: Intact with normal signal.

LIGAMENTS

Medial stabilizers: Intact.

Lateral stabilizers: The lateral ulnar and radial collateral
ligaments appear intact.

Cartilage: Mild spurring of the coronoid process with underlying
subchondral cyst formation. No focal chondral defect identified.

Joint: No joint effusion or loose body observed.

Cubital tunnel: Unremarkable.  The ulnar nerve appears normal.

Bones: No acute or significant extra-articular osseous findings.

Other: None.
IMPRESSION: 1. Common extensor tendinosis and partial intrasubstance tearing.
2. Mild common flexor tendinosis and mild spurring of the coronoid
process.
3. No acute osseous findings or intra-articular loose bodies
identified. The elbow ligaments appear intact.

## 2022-05-20 DIAGNOSIS — L304 Erythema intertrigo: Secondary | ICD-10-CM | POA: Diagnosis not present

## 2022-05-20 DIAGNOSIS — D0462 Carcinoma in situ of skin of left upper limb, including shoulder: Secondary | ICD-10-CM | POA: Diagnosis not present

## 2022-05-20 DIAGNOSIS — L570 Actinic keratosis: Secondary | ICD-10-CM | POA: Diagnosis not present

## 2022-05-20 DIAGNOSIS — L814 Other melanin hyperpigmentation: Secondary | ICD-10-CM | POA: Diagnosis not present

## 2022-05-20 DIAGNOSIS — D225 Melanocytic nevi of trunk: Secondary | ICD-10-CM | POA: Diagnosis not present

## 2022-05-26 DIAGNOSIS — R0981 Nasal congestion: Secondary | ICD-10-CM | POA: Diagnosis not present

## 2022-05-26 DIAGNOSIS — J029 Acute pharyngitis, unspecified: Secondary | ICD-10-CM | POA: Diagnosis not present

## 2022-05-26 DIAGNOSIS — R058 Other specified cough: Secondary | ICD-10-CM | POA: Diagnosis not present

## 2022-05-26 DIAGNOSIS — U071 COVID-19: Secondary | ICD-10-CM | POA: Diagnosis not present

## 2022-08-06 DIAGNOSIS — J029 Acute pharyngitis, unspecified: Secondary | ICD-10-CM | POA: Diagnosis not present

## 2022-11-04 DIAGNOSIS — M545 Low back pain, unspecified: Secondary | ICD-10-CM | POA: Diagnosis not present

## 2022-11-04 DIAGNOSIS — H6121 Impacted cerumen, right ear: Secondary | ICD-10-CM | POA: Diagnosis not present

## 2022-11-04 DIAGNOSIS — L309 Dermatitis, unspecified: Secondary | ICD-10-CM | POA: Diagnosis not present

## 2022-11-04 DIAGNOSIS — K219 Gastro-esophageal reflux disease without esophagitis: Secondary | ICD-10-CM | POA: Diagnosis not present

## 2022-11-10 DIAGNOSIS — H524 Presbyopia: Secondary | ICD-10-CM | POA: Diagnosis not present

## 2022-11-10 DIAGNOSIS — H2512 Age-related nuclear cataract, left eye: Secondary | ICD-10-CM | POA: Diagnosis not present

## 2022-11-10 DIAGNOSIS — Z961 Presence of intraocular lens: Secondary | ICD-10-CM | POA: Diagnosis not present

## 2022-11-10 DIAGNOSIS — H52202 Unspecified astigmatism, left eye: Secondary | ICD-10-CM | POA: Diagnosis not present

## 2023-02-10 DIAGNOSIS — Z13220 Encounter for screening for lipoid disorders: Secondary | ICD-10-CM | POA: Diagnosis not present

## 2023-02-10 DIAGNOSIS — Z0000 Encounter for general adult medical examination without abnormal findings: Secondary | ICD-10-CM | POA: Diagnosis not present

## 2023-02-10 DIAGNOSIS — Z1211 Encounter for screening for malignant neoplasm of colon: Secondary | ICD-10-CM | POA: Diagnosis not present

## 2023-02-10 DIAGNOSIS — Z8546 Personal history of malignant neoplasm of prostate: Secondary | ICD-10-CM | POA: Diagnosis not present

## 2023-02-16 DIAGNOSIS — Z1211 Encounter for screening for malignant neoplasm of colon: Secondary | ICD-10-CM | POA: Diagnosis not present

## 2023-02-25 DIAGNOSIS — H73891 Other specified disorders of tympanic membrane, right ear: Secondary | ICD-10-CM | POA: Diagnosis not present

## 2023-02-25 DIAGNOSIS — H900 Conductive hearing loss, bilateral: Secondary | ICD-10-CM | POA: Diagnosis not present

## 2023-05-10 DIAGNOSIS — J069 Acute upper respiratory infection, unspecified: Secondary | ICD-10-CM | POA: Diagnosis not present

## 2023-05-24 DIAGNOSIS — J411 Mucopurulent chronic bronchitis: Secondary | ICD-10-CM | POA: Diagnosis not present

## 2023-05-25 DIAGNOSIS — D225 Melanocytic nevi of trunk: Secondary | ICD-10-CM | POA: Diagnosis not present

## 2023-05-25 DIAGNOSIS — L821 Other seborrheic keratosis: Secondary | ICD-10-CM | POA: Diagnosis not present

## 2023-05-25 DIAGNOSIS — Z85828 Personal history of other malignant neoplasm of skin: Secondary | ICD-10-CM | POA: Diagnosis not present

## 2023-05-25 DIAGNOSIS — D0439 Carcinoma in situ of skin of other parts of face: Secondary | ICD-10-CM | POA: Diagnosis not present

## 2023-05-25 DIAGNOSIS — L570 Actinic keratosis: Secondary | ICD-10-CM | POA: Diagnosis not present

## 2023-05-25 DIAGNOSIS — L814 Other melanin hyperpigmentation: Secondary | ICD-10-CM | POA: Diagnosis not present

## 2023-05-31 DIAGNOSIS — J329 Chronic sinusitis, unspecified: Secondary | ICD-10-CM | POA: Diagnosis not present

## 2023-05-31 DIAGNOSIS — J40 Bronchitis, not specified as acute or chronic: Secondary | ICD-10-CM | POA: Diagnosis not present

## 2023-07-16 DIAGNOSIS — J0101 Acute recurrent maxillary sinusitis: Secondary | ICD-10-CM | POA: Diagnosis not present

## 2023-07-16 DIAGNOSIS — Z6826 Body mass index (BMI) 26.0-26.9, adult: Secondary | ICD-10-CM | POA: Diagnosis not present

## 2023-08-18 ENCOUNTER — Other Ambulatory Visit (INDEPENDENT_AMBULATORY_CARE_PROVIDER_SITE_OTHER): Payer: Self-pay

## 2023-08-18 ENCOUNTER — Ambulatory Visit: Admitting: Surgical

## 2023-08-18 DIAGNOSIS — M25572 Pain in left ankle and joints of left foot: Secondary | ICD-10-CM | POA: Diagnosis not present

## 2023-08-18 DIAGNOSIS — M79672 Pain in left foot: Secondary | ICD-10-CM

## 2023-08-20 ENCOUNTER — Encounter: Payer: Self-pay | Admitting: Surgical

## 2023-08-20 ENCOUNTER — Encounter: Admitting: Orthopedic Surgery

## 2023-08-20 ENCOUNTER — Other Ambulatory Visit: Payer: Self-pay

## 2023-08-20 DIAGNOSIS — M545 Low back pain, unspecified: Secondary | ICD-10-CM

## 2023-08-20 NOTE — Progress Notes (Signed)
 Office Visit Note   Patient: Sean Estes           Date of Birth: 1965/11/28           MRN: [id number] Visit Date: 08/18/2023 Requested by: Elvera Lennox, PA-C [id number] [address] [place],  [place] [address] PCP: Elvera Lennox, PA-C  Subjective: Chief Complaint  Patient presents with   Left Foot - Pain   Left Ankle - Pain    HPI: Sean Estes is a 58 y.o. male who presents to the office reporting left foot pain.  Patient states that he has had about 1.5 months of pain in his foot without history of injury recently.  He localizes pain to the metatarsal heads on the plantar aspect of the foot.  He has occasional tingling sensation in this area that is not present in the right foot.  He denies any history of surgery to his left foot but he does have history of prior pilon fracture that was treated nonoperatively by Dr. Magnus Ivan.  Denies any history of diabetes.  Not really any significant ankle pain or pain in the hindfoot.  Patient also reports a history of low back pain.  He has significant scoliosis and degenerative disc disease as noted on previous radiographs.  He gets occasional radicular pain down either leg but the main concern that he has is his low back pain.  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  Pain will occasionally wake him up from sleep at night.  No red flag signs or symptoms.  Home exercise program and over-the-counter anti-inflammatories have not given him adequate relief from his back pain.  He has never tried L-spine ESI's.  No history of prior back surgery..                ROS: All systems reviewed are negative as they relate to the chief complaint within the history of present illness.  Patient denies fevers or chills.  Assessment & Plan: Visit Diagnoses:  1. Left foot pain   2. Pain in left ankle and joints of left foot     Plan: Patient is a 58 year old male who presents for evaluation of left foot pain.   Primarily has pain with tenderness along the metatarsal heads.  There is no reproduction of tingling or numbness concerning for Morton's neuroma but that is another possibility.  However he does have just generalized tenderness throughout every metatarsal head, mostly through the third and fourth.  We will try metatarsal pad in his shoe and new orthotics and see how this does for him.  Also he has continued pain in his low back that is severe at times.  He would like to try L-spine ESI's or facet joint injections.  Has never had MRI of his lumbar spine.  We will order MRI and follow-up after the scan in about 4 weeks so we can review this as well as see how his foot is doing.  Follow-Up Instructions: No follow-ups on file.   Orders:  Orders Placed This Encounter  Procedures   XR Foot Complete Left   XR Ankle Complete Left   No orders of the defined types were placed in this encounter.     Procedures: No procedures performed   Clinical Data: No additional findings.  Objective: Vital Signs: There were no vitals taken for this visit.  Physical Exam:  Constitutional: Patient appears well-developed HEENT:  Head: Normocephalic Eyes:EOM are  normal Neck: Normal range of motion Cardiovascular: Normal rate Pulmonary/chest: Effort normal Neurologic: Patient is alert Skin: Skin is warm Psychiatric: Patient has normal mood and affect  Ortho Exam: Ortho exam demonstrates left leg with intact hip flexion, quadricep, hamstring, dorsiflexion, plantarflexion, EHL strength rated 5/5.  He has tenderness throughout multiple metatarsal heads primarily at the third and fourth metatarsal heads.  No reproduced pain with compressing heads together by squeezing.  Negative Tinel sign in each webspace suggesting no Morton's neuroma.  Palpable DP pulse.  No tenderness throughout the ankle joint line, medial/lateral malleoli.  Achilles tendon is palpable and intact.  Anterior tibialis tendon is palpable and  intact.  No significant tenderness throughout the first MTP joint.  Low back with significant tenderness throughout the axial lumbar spine and paraspinal musculature.  Negative straight leg raise bilaterally.  No clonus noted bilaterally.  Specialty Comments:  No specialty comments available.  Imaging: No results found.   PMFS History: There are no active problems to display for this patient.  Past Medical History:  Diagnosis Date   Arthritis    Blood in urine    from prostate surgery   Cancer [facility])    prostate   History of blood transfusion    PONV (postoperative nausea and vomiting)     No family history on file.  Past Surgical History:  Procedure Laterality Date   BACK SURGERY     lumbar/ discectomy   EYE SURGERY Right    repair torn retina right with lens implant   FRACTURE SURGERY Left    PATELLA/ LOWER LEG_ LIMITED MOBILITY   INNER EAR SURGERY Right    KNEE SURGERY Left    ORIF x fracture of 12 bones/plates and screws retained/  LIMITED MOBILITY LEFT LEG   LYMPHADENECTOMY Bilateral 11/02/2012   Procedure: LYMPHADENECTOMY;  Surgeon: Milford Cage, MD;  Location: [facility];  Service: Urology;  Laterality: Bilateral;   ROBOT ASSISTED LAPAROSCOPIC RADICAL PROSTATECTOMY N/A 11/02/2012   Procedure: ROBOTIC ASSISTED LAPAROSCOPIC RADICAL PROSTATECTOMY LEVEL 2;  Surgeon: Milford Cage, MD;  Location: [facility];  Service: Urology;  Laterality: N/A;  WITH BILATERAL PELVIC LYMPH NODE DISSECTION    TONSILLECTOMY     TYMPANOMASTOIDECTOMY Left 03/22/2013   Procedure: TYMPANOMASTOIDECTOMY;  Surgeon: Christia Reading, MD;  Location: [facility];  Service: ENT;  Laterality: Left;   Social History   Occupational History   Not on file  Tobacco Use   Smoking status: Never   Smokeless tobacco: Never  Substance and Sexual Activity   Alcohol use: Yes    Comment: 1-2 shots liquor week   Drug use: No   Sexual activity: Not on file

## 2023-09-07 ENCOUNTER — Encounter: Payer: Self-pay | Admitting: Surgical

## 2023-09-10 ENCOUNTER — Ambulatory Visit
Admission: RE | Admit: 2023-09-10 | Discharge: 2023-09-10 | Disposition: A | Discharge status: Home / Self Care | Source: Ambulatory Visit | Attending: Surgical | Admitting: Surgical

## 2023-09-10 DIAGNOSIS — M419 Scoliosis, unspecified: Secondary | ICD-10-CM | POA: Diagnosis not present

## 2023-09-10 DIAGNOSIS — M545 Low back pain, unspecified: Secondary | ICD-10-CM

## 2023-09-10 DIAGNOSIS — M48061 Spinal stenosis, lumbar region without neurogenic claudication: Secondary | ICD-10-CM | POA: Diagnosis not present

## 2023-09-10 DIAGNOSIS — M4726 Other spondylosis with radiculopathy, lumbar region: Secondary | ICD-10-CM | POA: Diagnosis not present

## 2023-09-10 DIAGNOSIS — M5126 Other intervertebral disc displacement, lumbar region: Secondary | ICD-10-CM | POA: Diagnosis not present

## 2023-09-15 ENCOUNTER — Encounter: Payer: Self-pay | Admitting: Surgical

## 2023-09-15 ENCOUNTER — Ambulatory Visit (INDEPENDENT_AMBULATORY_CARE_PROVIDER_SITE_OTHER): Admitting: Surgical

## 2023-09-15 DIAGNOSIS — M47816 Spondylosis without myelopathy or radiculopathy, lumbar region: Secondary | ICD-10-CM

## 2023-09-15 DIAGNOSIS — M545 Low back pain, unspecified: Secondary | ICD-10-CM | POA: Diagnosis not present

## 2023-09-15 NOTE — Progress Notes (Signed)
 Follow-up Office Visit Note   Patient: Sean Estes           Date of Birth: 1965-10-25           MRN: [id number] Visit Date: 09/15/2023 Requested by: Elvera Lennox, PA-C [id number] [address] [place],  [place] [address] PCP: Elvera Lennox, PA-C  Subjective: Chief Complaint  Patient presents with   Left Foot - Pain, Follow-up   Lower Back - Follow-up, Pain    MRI review    HPI: Sean Estes is a 58 y.o. male who returns to the office for follow-up visit.    Plan at last visit was: Patient is a 58 year old male who presents for evaluation of left foot pain. Primarily has pain with tenderness along the metatarsal heads. There is no reproduction of tingling or numbness concerning for Morton's neuroma but that is another possibility. However he does have just generalized tenderness throughout every metatarsal head, mostly through the third and fourth. We will try metatarsal pad in his shoe and new orthotics and see how this does for him. Also he has continued pain in his low back that is severe at times. He would like to try L-spine ESI's or facet joint injections. Has never had MRI of his lumbar spine. We will order MRI and follow-up after the scan in about 4 weeks so we can review this as well as see how his foot is doing.   Since then, patient notes he continues to have chronic low back pain with axial lumbar spine pain and pain that radiates out to the left and right paraspinal region as well as to the buttock area.  He has stiffness primarily in the morning.  He uses a heating pad in the morning.  He gets better throughout the day.  He keeps up with the flexibility and workout routine at the gym.  Takes Tylenol arthritis with some relief.  Also reports that his left foot is feeling better since he got some orthotics that we discussed at his last visit.  He has only been using these over the last week or so.  Most of his pain is now away from the 3rd and 4th metatarsal head and now  around the first metatarsal head.              ROS: All systems reviewed are negative as they relate to the chief complaint within the history of present illness.  Patient denies fevers or chills.  Assessment & Plan: Visit Diagnoses:  1. Low back pain, unspecified back pain laterality, unspecified chronicity, unspecified whether sciatica present   2. Arthritis of facet joint of lumbar spine     Plan: Sean Estes is a 58 y.o. male who returns to the office for follow-up visit.  Plan from last visit was noted above in HPI.  They now return with MRI of the lumbar spine that has been done.  MRI demonstrates his known scoliosis with old, healed L2 compression fracture.  Most of his low back pain seems related to the facet joint arthritis noted at multiple levels.  He has some mild disc bulges but nothing that seems would cause significant pain.  Recommended that he try facet joint injections with Dr. Alvester Morin and consider ablation in the future if he has success with these.  He agreed with plan.  Since his foot pain is improving, we will watch this for now with his new orthotics and if he has no improvement in the  next 4 to 6 weeks, he may return for repeat evaluation.  Follow-up as needed.  Follow-Up Instructions: No follow-ups on file.   Orders:  Orders Placed This Encounter  Procedures   Ambulatory referral to Physical Medicine Rehab   No orders of the defined types were placed in this encounter.     Procedures: No procedures performed   Clinical Data: No additional findings.  Objective: Vital Signs: There were no vitals taken for this visit.  Physical Exam:  Constitutional: Patient appears well-developed HEENT:  Head: Normocephalic Eyes:EOM are normal Neck: Normal range of motion Cardiovascular: Normal rate Pulmonary/chest: Effort normal Neurologic: Patient is alert Skin: Skin is warm Psychiatric: Patient has normal mood and affect  Ortho Exam: Ortho exam demonstrates  left foot with palpable DP pulse.  No significant pain over the first MTP joint.  No pain with range of motion of the MTP joint.  He has some mild tenderness over the 1st and 2nd metatarsal heads with more tenderness between in the first webspace.  No numbness.  Negative Tinel sign.  Specialty Comments:  No specialty comments available.  Imaging: No results found.   PMFS History: There are no active problems to display for this patient.  Past Medical History:  Diagnosis Date   Arthritis    Blood in urine    from prostate surgery   Cancer [facility])    prostate   History of blood transfusion    PONV (postoperative nausea and vomiting)     No family history on file.  Past Surgical History:  Procedure Laterality Date   BACK SURGERY     lumbar/ discectomy   EYE SURGERY Right    repair torn retina right with lens implant   FRACTURE SURGERY Left    PATELLA/ LOWER LEG_ LIMITED MOBILITY   INNER EAR SURGERY Right    KNEE SURGERY Left    ORIF x fracture of 12 bones/plates and screws retained/  LIMITED MOBILITY LEFT LEG   LYMPHADENECTOMY Bilateral 11/02/2012   Procedure: LYMPHADENECTOMY;  Surgeon: Milford Cage, MD;  Location: [facility];  Service: Urology;  Laterality: Bilateral;   ROBOT ASSISTED LAPAROSCOPIC RADICAL PROSTATECTOMY N/A 11/02/2012   Procedure: ROBOTIC ASSISTED LAPAROSCOPIC RADICAL PROSTATECTOMY LEVEL 2;  Surgeon: Milford Cage, MD;  Location: [facility];  Service: Urology;  Laterality: N/A;  WITH BILATERAL PELVIC LYMPH NODE DISSECTION    TONSILLECTOMY     TYMPANOMASTOIDECTOMY Left 03/22/2013   Procedure: TYMPANOMASTOIDECTOMY;  Surgeon: Christia Reading, MD;  [facility];  Service: ENT;  Laterality: Left;   Social History   Occupational History   Not on file  Tobacco Use   Smoking status: Never   Smokeless tobacco: Never  Substance and Sexual Activity   Alcohol use: Yes    Comment: 1-2 shots liquor week   Drug use: No   Sexual activity: Not on file

## 2023-09-27 ENCOUNTER — Encounter: Payer: Self-pay | Admitting: Physical Medicine and Rehabilitation

## 2023-09-27 ENCOUNTER — Ambulatory Visit: Admitting: Physical Medicine and Rehabilitation

## 2023-09-27 DIAGNOSIS — M545 Low back pain, unspecified: Secondary | ICD-10-CM | POA: Diagnosis not present

## 2023-09-27 DIAGNOSIS — M47819 Spondylosis without myelopathy or radiculopathy, site unspecified: Secondary | ICD-10-CM

## 2023-09-27 DIAGNOSIS — G8929 Other chronic pain: Secondary | ICD-10-CM

## 2023-09-27 DIAGNOSIS — M47816 Spondylosis without myelopathy or radiculopathy, lumbar region: Secondary | ICD-10-CM

## 2023-09-27 NOTE — Progress Notes (Signed)
 Core Outcome Measures Index (COMI) Back Score  Average Pain 2  COMI Score 20%
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
## 2023-09-27 NOTE — Progress Notes (Unsigned)
 Pain Scale   Average Pain 3 Patient advising he has constant back pain, pain worsens with standing and sitting prolonged amounts of time. Patient advising heat does help some.        +Driver, -BT, -Dye Allergies.

## 2023-09-27 NOTE — Patient Instructions (Signed)

## 2023-10-15 ENCOUNTER — Ambulatory Visit: Admitting: Physical Therapy

## 2023-10-26 DIAGNOSIS — L821 Other seborrheic keratosis: Secondary | ICD-10-CM | POA: Diagnosis not present

## 2023-10-27 ENCOUNTER — Ambulatory Visit (INDEPENDENT_AMBULATORY_CARE_PROVIDER_SITE_OTHER): Admitting: Physical Therapy

## 2023-10-27 ENCOUNTER — Encounter: Payer: Self-pay | Admitting: Physical Therapy

## 2023-10-27 DIAGNOSIS — M5459 Other low back pain: Secondary | ICD-10-CM

## 2023-10-27 DIAGNOSIS — R293 Abnormal posture: Secondary | ICD-10-CM | POA: Diagnosis not present

## 2023-10-27 DIAGNOSIS — M6281 Muscle weakness (generalized): Secondary | ICD-10-CM | POA: Diagnosis not present

## 2023-10-27 NOTE — Therapy (Signed)
 OUTPATIENT PHYSICAL THERAPY THORACOLUMBAR EVALUATION   Patient Name: Sean Estes MRN: [id number] DOB:05/05/1966, 58 y.o., male Today's Date: 10/27/2023  END OF SESSION:  PT End of Session - 10/27/23 1020     Visit Number 1    Number of Visits 10    Date for PT Re-Evaluation 01/07/24    PT Start Time 0800    PT Stop Time 0847    PT Time Calculation (min) 47 min    Activity Tolerance Patient tolerated treatment well    Behavior During Therapy WFL for tasks assessed/performed             Past Medical History:  Diagnosis Date   Arthritis    Blood in urine    from prostate surgery   Cancer (HCC)    prostate   History of blood transfusion    PONV (postoperative nausea and vomiting)    Past Surgical History:  Procedure Laterality Date   BACK SURGERY     lumbar/ discectomy   EYE SURGERY Right    repair torn retina right with lens implant   FRACTURE SURGERY Left    PATELLA/ LOWER LEG_ LIMITED MOBILITY   INNER EAR SURGERY Right    KNEE SURGERY Left    ORIF x fracture of 12 bones/plates and screws retained/  LIMITED MOBILITY LEFT LEG   LYMPHADENECTOMY Bilateral 11/02/2012   Procedure: LYMPHADENECTOMY;  Surgeon: Soledad Dupes, MD;  Location: [facility];  Service: Urology;  Laterality: Bilateral;   ROBOT ASSISTED LAPAROSCOPIC RADICAL PROSTATECTOMY N/A 11/02/2012   Procedure: ROBOTIC ASSISTED LAPAROSCOPIC RADICAL PROSTATECTOMY LEVEL 2;  Surgeon: Soledad Dupes, MD;  Location: [facility];  Service: Urology;  Laterality: N/A;  WITH BILATERAL PELVIC LYMPH NODE DISSECTION    TONSILLECTOMY     TYMPANOMASTOIDECTOMY Left 03/22/2013   Procedure: TYMPANOMASTOIDECTOMY;  Surgeon: Virgina Grills, MD;  Location: [facility] OR;  Service: ENT;  Laterality: Left;   There are no active problems to display for this patient.   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   PERTINENT HISTORY:  See PMH  PAIN:  NPRS scale: no pain at rest, can reach 5-6/10 Pain location: low back Pain description: achy, stiffness Aggravating factors: bending, lifting, squatting Relieving factors: over the counter meds as needed, stretching, exercise  PRECAUTIONS: None  WEIGHT BEARING RESTRICTIONS: No  FALLS:  Has patient fallen in last 6 months? No  LIVING ENVIRONMENT: Lives with: lives with their family and lives with their spouse Lives in: House/apartment   OCCUPATION: Curator  PLOF: Independent  PATIENT GOALS: Be able to work with minimal pain to no pain  Next MD Visit:    OBJECTIVE:   DIAGNOSTIC FINDINGS:  IMPRESSION: 1. Scoliotic curvature convex to the left. Old compression fracture at L2 with loss of height anteriorly of 40%. No retropulsed bone. 2. L2-3: Bulging of the disc more prominent towards the right. Facet and ligamentous hypertrophy more marked on the right. Narrowing of the right lateral recess and intervertebral foramen on the right that could cause right-sided neural compression. 3. L4-5: Bulging of the disc. Bilateral facet and ligamentous hypertrophy. Mild stenosis of the lateral recesses and foramina but without visible neural compression. 4. L5-S1: Previous partial right hemilaminectomy. Disc degeneration with endplate osteophytes and bulging of the disc. Facet hypertrophy left  more than right. Mild narrowing of the subarticular lateral recess on the left but no visible neural compression. 5. Mild edematous endplate changes at L2-3 and L5-S1 could relate to regional pain.  PATIENT SURVEYS:  Patient-Specific Activity Scoring Scheme  "0" represents "unable to perform." "10" represents "able to perform at prior level. 0 1 2 3 4 5 6 7 8 9  10 (Date and Score)   Activity Eval  10/27/23    1. Waking up 5     2. Bending over /leaning 5     3. standing 5   4.    5.    Score 5    Total score = sum of the activity scores/number of activities Minimum detectable change (90%CI) for average score = 2 points Minimum detectable change (90%CI) for single activity score = 3 points  SCREENING FOR RED FLAGS: Bowel or bladder incontinence: No Cauda equina syndrome: No  COGNITION: Overall cognitive status: WFL normal      SENSATION: WFL  MUSCLE LENGTH: Hamstrings: Right 106 deg; Left 104 deg   POSTURE:  rounded shoulders and forward head  PALPATION: TTP: lumbar paraspinals  LUMBAR ROM:   Directional Preference Assessment: Centralization: Peripheralization:   AROM Eval 10/27/23  Flexion 60  Extension 5  Right lateral flexion 30  Left lateral flexion 36  Right rotation More thoracic rotation  WFL  Left rotation [facility] More thoracic rotation   (Blank rows = not tested)  LOWER EXTREMITY ROM:     ROM  Right 10/27/23 Left 10/27/23  Hip flexion 120 120  Hip extension    Hip abduction    Hip adduction    Hip internal rotation    Hip external rotation    Knee flexion    Knee extension     (Blank rows = not tested)  LOWER EXTREMITY MMT:    MMT Right 10/27/23 Left 10/27/23  Hip flexion    Hip extension    Hip abduction    Hip adduction    Hip internal rotation    Hip external rotation    Knee flexion 64.5 ppsi 50.2 ppsi  Knee extension 92.6 ppsi 78.9 ppsi  Ankle dorsiflexion    Ankle  plantarflexion    Ankle inversion    Ankle eversion      (Blank rows = not tested)  LUMBAR SPECIAL TESTS:  Slump test: Negative  Functional Test:  10/27/23:  Rt SLS: 10 seconds Left SLS: 4 seconds                                                                                                                                                                                                                     TODAY'S TREATMENT:                                                                                                         DATE:  10/27/23  Therex:    HEP instruction/performance c cues for techniques, handout provided.  Trial set performed of each for comprehension and symptom assessment.  See below for exercise list  PATIENT EDUCATION:  Education details: HEP, POC Person educated: Patient Education method: Explanation, Demonstration, Verbal cues, and Handouts Education comprehension: verbalized understanding, returned demonstration, and verbal cues required  HOME EXERCISE PROGRAM: Access Code: [email or web address] URL: [email or web address] Date: 10/27/2023 Prepared by: Jerrel Mor  Exercises - Supine Bridge  - 2 x daily - 7 x weekly - 2 sets - 10 reps - 5 seconds hold - Supine Double Knee to Chest Modified  - 2 x daily - 7 x weekly - 3 reps - 20 seconds hold - Supine Piriformis Stretch with Leg Straight  - 2 x daily - 7 x weekly - 3 reps - 20 seconds hold - The Hundred 2 Beginner  - 2 x daily - 7 x weekly - 3-4 sets - 20-25 reps - Standing Lumbar Extension at Wall - Forearms  - 2 x daily - 7 x weekly - 5 reps - 10 seconds hold  ASSESSMENT:  CLINICAL IMPRESSION: Patient is a 58 y.o. who comes to clinic with complaints of low back pain with mobility, strength and movement coordination deficits that impair their ability to perform usual daily and recreational functional activities without increase difficulty/symptoms at this time.  Patient to  benefit from skilled PT services to address impairments and limitations to  improve to previous level of function without restriction secondary to condition.   OBJECTIVE IMPAIRMENTS: decreased mobility, decreased ROM, impaired flexibility, and pain.   ACTIVITY LIMITATIONS: bending and standing  PARTICIPATION LIMITATIONS: occupation  PERSONAL FACTORS: see PMH are also affecting patient's functional outcome.   REHAB POTENTIAL: Good  CLINICAL DECISION MAKING: Stable/uncomplicated  EVALUATION COMPLEXITY: Low   GOALS: Goals reviewed with patient? Yes  SHORT TERM GOALS: (target date for Short term goals are 3 weeks 11/17/2023)  1. Patient will demonstrate independent use of home exercise program to maintain progress from in clinic treatments.  Goal status: New  LONG TERM GOALS: (target dates for all long term goals are 10 weeks 01/07/2024 )   1. Patient will demonstrate/report pain at worst less than or equal to 2/10 to facilitate minimal limitation in daily activity secondary to pain symptoms.  Goal status: New   2. Patient will demonstrate independent use of home exercise program to facilitate ability to maintain/progress functional gains from skilled physical therapy services.  Goal status: New   3. Patient will demonstrate Patient specific functional scale avg > or = 7 to indicate reduced disability due to condition.   Goal status: New   4. Patient will demonstrate lumbar extension 100 % WFL s symptoms to facilitate upright standing, walking posture at PLOF s limitation.  Goal status: New   5.  Be able to squat using correct body mechanics with pain </= 2/10.   Goal status: New     PLAN:  PT FREQUENCY: 1x/week  PT DURATION: 10 weeks  PLANNED INTERVENTIONS: Can include 86578- PT Re-evaluation, 97110-Therapeutic exercises, 97530- Therapeutic activity, 97112- Neuromuscular re-education, [phone]- Self Care, 97140- Manual therapy, [phone]- Gait training, [phone]- Orthotic Fit/training, [phone]- Canalith repositioning, [id number]- Aquatic Therapy, [phone]  Electrical stimulation (unattended), 97750 Physical performance testing, [id number]- Electrical stimulation (manual), 97016- Vasopneumatic device, [id number]- Ultrasound, [id number]- Traction (mechanical), [id number]- Ionotophoresis 4mg /ml Dexamethasone , Patient/Family education, Balance training, Stair training, Taping, Dry Needling, Joint mobilization, Joint manipulation, Spinal manipulation, Spinal mobilization, Scar mobilization, Vestibular training, Visual/preceptual remediation/compensation, DME instructions, Cryotherapy, and Moist heat.  All performed as medically necessary.  All included unless contraindicated  PLAN FOR NEXT SESSION: Review HEP knowledge/results. Core strengthening     Marysue Sola, PT, MPT 10/27/2023, 10:22 AM

## 2023-11-10 ENCOUNTER — Encounter: Payer: Self-pay | Admitting: Orthopedic Surgery

## 2023-11-10 ENCOUNTER — Ambulatory Visit: Admitting: Orthopedic Surgery

## 2023-11-10 DIAGNOSIS — M79672 Pain in left foot: Secondary | ICD-10-CM

## 2023-11-10 NOTE — Progress Notes (Signed)
 Office Visit Note   Patient: Sean Estes           Date of Birth: January 02, 1966           MRN: [id number] Visit Date: 11/10/2023 Requested by: Domingo Friend, PA-C [phone] [place],  [place] [address] PCP: Domingo Friend, PA-C  Subjective: Chief Complaint  Patient presents with   Left Foot - Pain    HPI: Sean Estes is a 58 y.o. male who presents to the office reporting multiple orthopedic complaints today including hand numbness and persistent left foot pain as well as some back pain.  Localizes pain in that left foot between the 1st and 2nd metatarsal head.  He does have an insert.  His shoes are examined today as well.  He also describes worsening left hand numbness and tingling.  Has an EMG nerve study which showed mild carpal tunnel syndrome about 2 years ago.  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  He also is having some persistent low back pain which is being worked up by Dr. Daisey Dryer.  He is not really a candidate yet for back injections but he is doing physical therapy for his back..                ROS: All systems reviewed are negative as they relate to the chief complaint within the history of present illness.  Patient denies fevers or chills.  Assessment & Plan: Visit Diagnoses:  1. Left foot pain     Plan: Impression is worsening carpal tunnel syndrome clinically.  Organ to try him with a volar wrist splint and if that does not help nerve study is indicated to evaluate progression of carpal tunnel syndrome.  No abductor pollicis brevis wasting today.  We will try that splint for now.  He will order that online.  Regarding his left foot he is wearing shoes that are too tight around the toebox.  This is likely contributing to some metatarsal type pain between the 1st and 2nd metatarsal heads.  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  Talked about MRI scanning of that foot but he wants to try wider toe box shoes for now.  He will let us   know if these are helping.  Follow-Up Instructions: No follow-ups on file.   Orders:  No orders of the defined types were placed in this encounter.  No orders of the defined types were placed in this encounter.     Procedures: No procedures performed   Clinical Data: No additional findings.  Objective: Vital Signs: There were no vitals taken for this visit.  Physical Exam:  Constitutional: Patient appears well-developed HEENT:  Head: Normocephalic Eyes:EOM are normal Neck: Normal range of motion Cardiovascular: Normal rate Pulmonary/chest: Effort normal Neurologic: Patient is alert Skin: Skin is warm Psychiatric: Patient has normal mood and affect  Ortho Exam: Ortho exam demonstrates normal gait alignment.  He does have wide feet which do fit very tightly within his current shoes.  Has some tenderness to palpation between the 1st and 2nd metatarsal heads on the left with palpable pedal pulses.  Arch is intact.  Palpable intact nontender anterior to posterior to peroneal and Achilles tendons.  No pain with pronation supination of the forefoot.  MTP range of motion is essentially symmetric left versus right.  Does have tenderness to palpation between the 1st and 2nd metatarsal head with worsening of symptoms with compression in this area.  Specialty Comments:  Narrative & Impression CLINICAL DATA:  Low back pain radiating to both hips and feet.   EXAM: MRI LUMBAR SPINE WITHOUT CONTRAST   TECHNIQUE: Multiplanar, multisequence MR imaging of the lumbar spine was performed. No intravenous contrast was administered.   COMPARISON:  Radiography 01/30/2021   FINDINGS: Segmentation:  5 lumbar type vertebral bodies.   Alignment: Scoliotic curvature convex to the left with the apex at L1-2.   Vertebrae: Old compression fracture at L2 with loss of height anteriorly of 40%. No retropulsed bone. Mild edematous endplate changes on the right at L2-3 and on the left at L5-S1  could relate to regional pain.   Conus medullaris and cauda equina: Conus extends to the L1 level. Conus and cauda equina appear normal.   Paraspinal and other soft tissues: Negative   Disc levels:   T12-L1: Normal   L1-2: Mild bulging of the disc.  No compressive stenosis.   L2-3: Bulging of the disc more prominent towards the right. Facet and ligamentous hypertrophy more marked on the right. Narrowing of the right lateral recess and intervertebral foramen on the right that could cause right-sided neural compression. No significant stenosis on the left.   L3-4: Mild bulging of the disc. Mild facet and ligamentous hypertrophy. No compressive stenosis.   L4-5: Mild bulging of the disc. Bilateral facet and ligamentous hypertrophy. Mild stenosis of the lateral recesses and foramina but without visible neural compression.   L5-S1: Previous partial right hemilaminectomy. Disc degeneration with endplate osteophytes and bulging of the disc. Facet hypertrophy left more than right. Mild narrowing of the subarticular lateral recess on the left but no visible neural compression.   IMPRESSION: 1. Scoliotic curvature convex to the left. Old compression fracture at L2 with loss of height anteriorly of 40%. No retropulsed bone. 2. L2-3: Bulging of the disc more prominent towards the right. Facet and ligamentous hypertrophy more marked on the right. Narrowing of the right lateral recess and intervertebral foramen on the right that could cause right-sided neural compression. 3. L4-5: Bulging of the disc. Bilateral facet and ligamentous hypertrophy. Mild stenosis of the lateral recesses and foramina but without visible neural compression. 4. L5-S1: Previous partial right hemilaminectomy. Disc degeneration with endplate osteophytes and bulging of the disc. Facet hypertrophy left more than right. Mild narrowing of the subarticular lateral recess on the left but no visible neural  compression. 5. Mild edematous endplate changes at L2-3 and L5-S1 could relate to regional pain.     Electronically Signed   By: Bettylou Brunner M.D.   On: 09/18/2023 11:35  Imaging: No results found.   PMFS History: There are no active problems to display for this patient.  Past Medical History:  Diagnosis Date   Arthritis    Blood in urine    from prostate surgery   Cancer [facility])    prostate   History of blood transfusion    PONV (postoperative nausea and vomiting)     No family history on file.  Past Surgical History:  Procedure Laterality Date   BACK SURGERY     lumbar/ discectomy   EYE SURGERY Right    repair torn retina right with lens implant   FRACTURE SURGERY Left    PATELLA/ LOWER LEG_ LIMITED MOBILITY   INNER EAR SURGERY Right    KNEE SURGERY Left    ORIF x fracture of 12 bones/plates and screws retained/  LIMITED MOBILITY LEFT LEG   LYMPHADENECTOMY Bilateral 11/02/2012   Procedure: LYMPHADENECTOMY;  Surgeon:  Soledad Dupes, MD;  Location: [facility];  Service: Urology;  Laterality: Bilateral;   ROBOT ASSISTED LAPAROSCOPIC RADICAL PROSTATECTOMY N/A 11/02/2012   Procedure: ROBOTIC ASSISTED LAPAROSCOPIC RADICAL PROSTATECTOMY LEVEL 2;  Surgeon: Soledad Dupes, MD;  Location: [facility];  Service: Urology;  Laterality: N/A;  WITH BILATERAL PELVIC LYMPH NODE DISSECTION    TONSILLECTOMY     TYMPANOMASTOIDECTOMY Left 03/22/2013   Procedure: TYMPANOMASTOIDECTOMY;  Surgeon: Virgina Grills, MD;  Location: [facility] OR;  Service: ENT;  Laterality: Left;   Social History   Occupational History   Not on file  Tobacco Use   Smoking status: Never   Smokeless tobacco: Never  Substance and Sexual Activity   Alcohol use: Yes    Comment: 1-2 shots liquor week   Drug use: No   Sexual activity: Not on file

## 2023-11-16 ENCOUNTER — Ambulatory Visit (INDEPENDENT_AMBULATORY_CARE_PROVIDER_SITE_OTHER): Admitting: Physical Therapy

## 2023-11-16 ENCOUNTER — Encounter: Payer: Self-pay | Admitting: Physical Therapy

## 2023-11-16 DIAGNOSIS — M6281 Muscle weakness (generalized): Secondary | ICD-10-CM | POA: Diagnosis not present

## 2023-11-16 DIAGNOSIS — M5459 Other low back pain: Secondary | ICD-10-CM | POA: Diagnosis not present

## 2023-11-16 DIAGNOSIS — R293 Abnormal posture: Secondary | ICD-10-CM

## 2023-11-16 NOTE — Therapy (Addendum)
 OUTPATIENT PHYSICAL THERAPY THORACOLUMBAR  Discharge  Patient Name: Sean Estes MRN: [id number] DOB:February 10, 1966, 58 y.o., male Today's Date: 11/16/2023  END OF SESSION:  PT End of Session - 11/16/23 0930     Visit Number 2    Number of Visits 10    Date for PT Re-Evaluation 01/07/24    PT Start Time 0800    PT Stop Time 0848    PT Time Calculation (min) 48 min    Activity Tolerance Patient tolerated treatment well    Behavior During Therapy WFL for tasks assessed/performed              Past Medical History:  Diagnosis Date   Arthritis    Blood in urine    from prostate surgery   Cancer (HCC)    prostate   History of blood transfusion    PONV (postoperative nausea and vomiting)    Past Surgical History:  Procedure Laterality Date   BACK SURGERY     lumbar/ discectomy   EYE SURGERY Right    repair torn retina right with lens implant   FRACTURE SURGERY Left    PATELLA/ LOWER LEG_ LIMITED MOBILITY   INNER EAR SURGERY Right    KNEE SURGERY Left    ORIF x fracture of 12 bones/plates and screws retained/  LIMITED MOBILITY LEFT LEG   LYMPHADENECTOMY Bilateral 11/02/2012   Procedure: LYMPHADENECTOMY;  Surgeon: Toribio Neysa Repine, MD;  Location: [facility];  Service: Urology;  Laterality: Bilateral;   ROBOT ASSISTED LAPAROSCOPIC RADICAL PROSTATECTOMY N/A 11/02/2012   Procedure: ROBOTIC ASSISTED LAPAROSCOPIC RADICAL PROSTATECTOMY LEVEL 2;  Surgeon: Toribio Neysa Repine, MD;  Location: [facility];  Service: Urology;  Laterality: N/A;  WITH BILATERAL PELVIC LYMPH NODE DISSECTION    TONSILLECTOMY     TYMPANOMASTOIDECTOMY Left 03/22/2013   Procedure: TYMPANOMASTOIDECTOMY;  Surgeon: Vaughan Ricker, MD;  Location: [facility] OR;  Service: ENT;  Laterality: Left;   There are no active problems to display for this patient.   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 Pt also concerned about his left middle finger hurting. Pt stating he has been using a resting night splint for comfort to prevent his hand from falling asleep at night. Pt stating his home exercises are going ok, when he does them.   PERTINENT HISTORY:  See PMH  PAIN:  NPRS scale:2/10, can reach 5-6/10 Pain location: low back Pain description: achy, stiffness Aggravating factors: bending, lifting, squatting Relieving factors: over the counter meds as needed, stretching, exercise  PRECAUTIONS: None  WEIGHT BEARING RESTRICTIONS: No  FALLS:  Has patient fallen in last 6 months? No  LIVING ENVIRONMENT: Lives with: lives with their family and lives with their spouse Lives in: House/apartment   OCCUPATION: Curator  PLOF: Independent  PATIENT GOALS: Be able to work with minimal pain to no pain  Next MD Visit:    OBJECTIVE:   DIAGNOSTIC FINDINGS:  IMPRESSION: 1. Scoliotic curvature convex to the left. Old compression fracture at L2 with loss of height anteriorly of 40%. No retropulsed bone. 2. L2-3: Bulging of the disc more prominent towards the right. Facet and ligamentous hypertrophy more marked on the right. Narrowing of the right lateral recess and intervertebral foramen on the right that could cause right-sided neural compression. 3. L4-5: Bulging of the disc. Bilateral facet and ligamentous hypertrophy.  Mild stenosis of the lateral recesses and foramina but without visible neural compression. 4. L5-S1: Previous partial right hemilaminectomy. Disc degeneration with endplate osteophytes and bulging of the disc. Facet hypertrophy left more than right. Mild narrowing of the subarticular lateral recess on the left but no visible neural compression. 5. Mild edematous endplate changes at L2-3 and L5-S1 could relate to regional pain.  PATIENT SURVEYS:  Patient-Specific Activity Scoring Scheme  0 represents "unable to perform." 10 represents "able to perform at prior level. 0 1 2 3 4 5 6 7 8 9  10 (Date and Score)   Activity Eval  10/27/23    1. Waking up 5     2. Bending over /leaning 5     3. standing 5   4.    5.    Score 5    Total score = sum of the activity scores/number of activities Minimum detectable change (90%CI) for average score = 2 points Minimum detectable change (90%CI) for single activity score = 3 points  SCREENING FOR RED FLAGS: Bowel or bladder incontinence: No Cauda equina syndrome: No  COGNITION: Overall cognitive status: WFL normal      SENSATION: WFL  MUSCLE LENGTH: Hamstrings: Right 106 deg; Left 104 deg   POSTURE:  rounded shoulders and forward head  PALPATION: TTP: lumbar paraspinals  LUMBAR ROM:   Directional Preference Assessment: Centralization: Peripheralization:   AROM Eval 10/27/23  Flexion 60  Extension 5  Right lateral flexion 30  Left lateral flexion 36  Right rotation More thoracic rotation  WFL  Left rotation [facility] More thoracic rotation   (Blank rows = not tested)  LOWER EXTREMITY ROM:     ROM  Right 10/27/23 Left 10/27/23  Hip flexion 120 120  Hip extension    Hip abduction    Hip adduction    Hip internal rotation    Hip external rotation    Knee flexion    Knee extension     (Blank rows = not tested)  LOWER EXTREMITY MMT:    MMT Right 10/27/23 Left 10/27/23  Hip flexion    Hip extension  Hip  abduction    Hip adduction    Hip internal rotation    Hip external rotation    Knee flexion 64.5 ppsi 50.2 ppsi  Knee extension 92.6 ppsi 78.9 ppsi  Ankle dorsiflexion    Ankle plantarflexion    Ankle inversion    Ankle eversion     (Blank rows = not tested)  LUMBAR SPECIAL TESTS:  Slump test: Negative  Functional Test:  10/27/23:  Rt SLS: 10 seconds Left SLS: 4 seconds                                                                                                                                                                                                                    TODAY'S TREATMENT:                                                                                                         DATE:  11/16/23  Therex: Rows: blue TB 2 x 10 holding 3 sec Shoulder extension blue TB 2 x 10 holding 3 sec Bridge: x 10 holding 5 sec Hundred beginner: 10 pulses x 5  Single knee to chest: x 3 bil LE TherActivites:  Leg Press bil: 75# 3 x 10   Trigger Point Dry Needling Initial Treatment: Pt instructed on Dry Needling rational, procedures, and possible side effects. Pt instructed to expect mild to moderate muscle soreness later in the day and/or into the next day.  Pt instructed in methods to reduce muscle soreness. Pt instructed to continue prescribed HEP. Because Dry Needling was performed over or adjacent to a lung field, pt was educated on S/S of pneumothorax and to seek immediate medical attention should they occur.  Patient verbalized understanding of these instructions and education.  Patient Verbal Consent Given: Yes Education Handout Provided: Yes Muscles treated: glute medius, piriformis c E-stim x 10 minutes, intensity to pt's tolreance Treatment response/outcome: local twitch response    TODAY'S TREATMENT:  DATE:  10/27/23  Therex:    HEP  instruction/performance c cues for techniques, handout provided.  Trial set performed of each for comprehension and symptom assessment.  See below for exercise list    PATIENT EDUCATION:  Education details: HEP, POC Person educated: Patient Education method: Explanation, Demonstration, Verbal cues, and Handouts Education comprehension: verbalized understanding, returned demonstration, and verbal cues required  HOME EXERCISE PROGRAM: Access Code: [email or web address] URL: [email or web address] Date: 10/27/2023 Prepared by: Delon Lunger  Exercises - Supine Bridge  - 2 x daily - 7 x weekly - 2 sets - 10 reps - 5 seconds hold - Supine Double Knee to Chest Modified  - 2 x daily - 7 x weekly - 3 reps - 20 seconds hold - Supine Piriformis Stretch with Leg Straight  - 2 x daily - 7 x weekly - 3 reps - 20 seconds hold - The Hundred 2 Beginner  - 2 x daily - 7 x weekly - 3-4 sets - 20-25 reps - Standing Lumbar Extension at Wall - Forearms  - 2 x daily - 7 x weekly - 5 reps - 10 seconds hold   ASSESSMENT:  CLINICAL IMPRESSION: Pt has had acupuncture in the past with good response and wishing to try DN this visit. Pt with good response to DN with Electrical stimulation eliciting muscle twitches. Pt's HEP was reviewed this visit and pt able to demonstrate compliance and good technique. Recommending continued skilled PT interventions.      OBJECTIVE IMPAIRMENTS: decreased mobility, decreased ROM, impaired flexibility, and pain.   ACTIVITY LIMITATIONS: bending and standing  PARTICIPATION LIMITATIONS: occupation  PERSONAL FACTORS: see PMH are also affecting patient's functional outcome.   REHAB POTENTIAL: Good  CLINICAL DECISION MAKING: Stable/uncomplicated  EVALUATION COMPLEXITY: Low   GOALS: Goals reviewed with patient? Yes  SHORT TERM GOALS: (target date for Short term goals are 3 weeks 11/17/2023)  1. Patient will demonstrate independent use of home exercise program to  maintain progress from in clinic treatments.  Goal status: MET 11/16/23  LONG TERM GOALS: (target dates for all long term goals are 10 weeks 01/07/2024 )   1. Patient will demonstrate/report pain at worst less than or equal to 2/10 to facilitate minimal limitation in daily activity secondary to pain symptoms.  Goal status: New   2. Patient will demonstrate independent use of home exercise program to facilitate ability to maintain/progress functional gains from skilled physical therapy services.  Goal status: New   3. Patient will demonstrate Patient specific functional scale avg > or = 7 to indicate reduced disability due to condition.   Goal status: New   4. Patient will demonstrate lumbar extension 100 % WFL s symptoms to facilitate upright standing, walking posture at PLOF s limitation.  Goal status: New   5.  Be able to squat using correct body mechanics with pain </= 2/10.   Goal status: New     PLAN:  PT FREQUENCY: 1x/week  PT DURATION: 10 weeks  PLANNED INTERVENTIONS: Can include 02853- PT Re-evaluation, 97110-Therapeutic exercises, 97530- Therapeutic activity, 97112- Neuromuscular re-education, [phone]- Self Care, 97140- Manual therapy, [phone]- Gait training, [phone]- Orthotic Fit/training, [phone]- Canalith repositioning, [id number]- Aquatic Therapy, [phone]- Electrical stimulation (unattended), 97750 Physical performance testing, [id number]- Electrical stimulation (manual), 97016- Vasopneumatic device, [id number]- Ultrasound, [id number]- Traction (mechanical), [id number]- Ionotophoresis 4mg /ml Dexamethasone , Patient/Family education, Balance training, Stair training, Taping, Dry Needling, Joint mobilization, Joint manipulation, Spinal manipulation, Spinal mobilization, Scar mobilization, Vestibular training, Visual/preceptual remediation/compensation, DME instructions, Cryotherapy, and Moist heat.  All performed as  medically necessary.  All included unless contraindicated  PLAN FOR NEXT SESSION: Review HEP  knowledge/results. Core strengthening     Delon JONELLE Lunger, PT, MPT 11/16/2023, 9:39 AM  PHYSICAL THERAPY DISCHARGE SUMMARY  Visits from Start of Care: 2  Current functional level related to goals / functional outcomes: See above   Remaining deficits: See above   Education / Equipment: HEP   Patient agrees to discharge. Patient goals were not met. Patient is being discharged due to not returning since the last visit.  Delon Lunger, PT, MPT 01/25/24 11:35 AM

## 2023-11-16 NOTE — Patient Instructions (Signed)

## 2023-11-23 ENCOUNTER — Encounter: Admitting: Physical Therapy

## 2023-12-07 ENCOUNTER — Encounter: Admitting: Physical Therapy

## 2024-04-10 ENCOUNTER — Encounter: Payer: Self-pay | Admitting: Radiology

## 2024-04-13 DIAGNOSIS — J329 Chronic sinusitis, unspecified: Secondary | ICD-10-CM | POA: Diagnosis not present

## 2024-04-13 DIAGNOSIS — Z1211 Encounter for screening for malignant neoplasm of colon: Secondary | ICD-10-CM | POA: Diagnosis not present

## 2024-04-13 DIAGNOSIS — Z13220 Encounter for screening for lipoid disorders: Secondary | ICD-10-CM | POA: Diagnosis not present

## 2024-04-13 DIAGNOSIS — L309 Dermatitis, unspecified: Secondary | ICD-10-CM | POA: Diagnosis not present

## 2024-04-13 DIAGNOSIS — Z0000 Encounter for general adult medical examination without abnormal findings: Secondary | ICD-10-CM | POA: Diagnosis not present

## 2024-04-13 DIAGNOSIS — Z8546 Personal history of malignant neoplasm of prostate: Secondary | ICD-10-CM | POA: Diagnosis not present

## 2024-04-13 DIAGNOSIS — R7301 Impaired fasting glucose: Secondary | ICD-10-CM | POA: Diagnosis not present

## 2024-04-13 DIAGNOSIS — Z7185 Encounter for immunization safety counseling: Secondary | ICD-10-CM | POA: Diagnosis not present

## 2024-04-13 DIAGNOSIS — Z125 Encounter for screening for malignant neoplasm of prostate: Secondary | ICD-10-CM | POA: Diagnosis not present

## 2024-04-19 ENCOUNTER — Telehealth: Payer: Self-pay | Admitting: Physical Medicine and Rehabilitation

## 2024-04-19 NOTE — Telephone Encounter (Signed)
 Pt called saying that his left middle finger is sticking. He wants to make an appt. Call back number is [phone]

## 2024-04-19 NOTE — Telephone Encounter (Signed)
 Pt called wanting to make an appt  because his middle finger is sticking, Call back number is [phone]

## 2024-05-16 NOTE — Progress Notes (Unsigned)
 Sean Estes - 58 y.o. male MRN [id number]  Date of birth: Sep 26, 1965  Office Visit Note: Visit Date: 05/17/2024 PCP: Horacio Hamilton, PA-C Referred by: Horacio Hamilton, PA-C  Subjective: No chief complaint on file.  HPI: Sean Estes is a pleasant 58 y.o. male who presents today for ***  Pertinent ROS were reviewed with the patient and found to be negative unless otherwise specified above in HPI.   Visit Reason: Duration of symptoms: Hand dominance: {RIGHT/LEFT:20294} Occupation: Diabetic: {yes/no:20286} Smoking: {yes/no:20286} Heart/Lung History: Blood Thinners:   Prior Testing/EMG: Injections (Date): Treatments: Prior Surgery:    Assessment & Plan: Visit Diagnoses: No diagnosis found.  Plan: ***  Follow-up: No follow-ups on file.   Meds & Orders: No orders of the defined types were placed in this encounter.  No orders of the defined types were placed in this encounter.    Procedures: No procedures performed      Clinical History: Narrative & Impression CLINICAL DATA:  Low back pain radiating to both hips and feet.   EXAM: MRI LUMBAR SPINE WITHOUT CONTRAST   TECHNIQUE: Multiplanar, multisequence MR imaging of the lumbar spine was performed. No intravenous contrast was administered.   COMPARISON:  Radiography 01/30/2021   FINDINGS: Segmentation:  5 lumbar type vertebral bodies.   Alignment: Scoliotic curvature convex to the left with the apex at L1-2.   Vertebrae: Old compression fracture at L2 with loss of height anteriorly of 40%. No retropulsed bone. Mild edematous endplate changes on the right at L2-3 and on the left at L5-S1 could relate to regional pain.   Conus medullaris and cauda equina: Conus extends to the L1 level. Conus and cauda equina appear normal.   Paraspinal and other soft tissues: Negative   Disc levels:   T12-L1: Normal   L1-2: Mild bulging of the disc.  No compressive stenosis.   L2-3: Bulging of the disc more  prominent towards the right. Facet and ligamentous hypertrophy more marked on the right. Narrowing of the right lateral recess and intervertebral foramen on the right that could cause right-sided neural compression. No significant stenosis on the left.   L3-4: Mild bulging of the disc. Mild facet and ligamentous hypertrophy. No compressive stenosis.   L4-5: Mild bulging of the disc. Bilateral facet and ligamentous hypertrophy. Mild stenosis of the lateral recesses and foramina but without visible neural compression.   L5-S1: Previous partial right hemilaminectomy. Disc degeneration with endplate osteophytes and bulging of the disc. Facet hypertrophy left more than right. Mild narrowing of the subarticular lateral recess on the left but no visible neural compression.   IMPRESSION: 1. Scoliotic curvature convex to the left. Old compression fracture at L2 with loss of height anteriorly of 40%. No retropulsed bone. 2. L2-3: Bulging of the disc more prominent towards the right. Facet and ligamentous hypertrophy more marked on the right. Narrowing of the right lateral recess and intervertebral foramen on the right that could cause right-sided neural compression. 3. L4-5: Bulging of the disc. Bilateral facet and ligamentous hypertrophy. Mild stenosis of the lateral recesses and foramina but without visible neural compression. 4. L5-S1: Previous partial right hemilaminectomy. Disc degeneration with endplate osteophytes and bulging of the disc. Facet hypertrophy left more than right. Mild narrowing of the subarticular lateral recess on the left but no visible neural compression. 5. Mild edematous endplate changes at L2-3 and L5-S1 could relate to regional pain.     Electronically Signed   By: Oneil Officer M.D.   On: 09/18/2023  11:35  He reports that he has never smoked. He has never used smokeless tobacco. No results for input(s): HGBA1C, LABURIC in the last 8760  hours.  Objective:   Vital Signs: There were no vitals taken for this visit.  Physical Exam  Gen: Well-appearing, in no acute distress; non-toxic CV: Regular Rate. Well-perfused. Warm.  Resp: Breathing unlabored on room air; no wheezing. Psych: Fluid speech in conversation; appropriate affect; normal thought process  Ortho Exam - ***   Imaging: No results found.  Past Medical/Family/Surgical/Social History: Medications & Allergies reviewed per EMR, new medications updated. There are no active problems to display for this patient.  Past Medical History:  Diagnosis Date   Arthritis    Blood in urine    from prostate surgery   Cancer [facility])    prostate   History of blood transfusion    PONV (postoperative nausea and vomiting)    No family history on file. Past Surgical History:  Procedure Laterality Date   BACK SURGERY     lumbar/ discectomy   EYE SURGERY Right    repair torn retina right with lens implant   FRACTURE SURGERY Left    PATELLA/ LOWER LEG_ LIMITED MOBILITY   INNER EAR SURGERY Right    KNEE SURGERY Left    ORIF x fracture of 12 bones/plates and screws retained/  LIMITED MOBILITY LEFT LEG   LYMPHADENECTOMY Bilateral 11/02/2012   Procedure: LYMPHADENECTOMY;  Surgeon: Toribio Neysa Repine, MD;  Location: [facility];  Service: Urology;  Laterality: Bilateral;   ROBOT ASSISTED LAPAROSCOPIC RADICAL PROSTATECTOMY N/A 11/02/2012   Procedure: ROBOTIC ASSISTED LAPAROSCOPIC RADICAL PROSTATECTOMY LEVEL 2;  Surgeon: Toribio Neysa Repine, MD;  Location: [facility];  Service: Urology;  Laterality: N/A;  WITH BILATERAL PELVIC LYMPH NODE DISSECTION    TONSILLECTOMY     TYMPANOMASTOIDECTOMY Left 03/22/2013   Procedure: TYMPANOMASTOIDECTOMY;  Surgeon: Vaughan Ricker, MD;  Location: [facility];  Service: ENT;  Laterality: Left;   Social History   Occupational History   Not on file  Tobacco Use   Smoking status: Never   Smokeless tobacco: Never  Substance and Sexual Activity    Alcohol use: Yes    Comment: 1-2 shots liquor week   Drug use: No   Sexual activity: Not on file    Burnetta Kohls Afton Alderton, M.D. [facility], Hand Surgery

## 2024-05-17 ENCOUNTER — Ambulatory Visit: Admitting: Orthopedic Surgery

## 2024-05-17 DIAGNOSIS — M65332 Trigger finger, left middle finger: Secondary | ICD-10-CM | POA: Diagnosis not present

## 2024-05-17 DIAGNOSIS — G5602 Carpal tunnel syndrome, left upper limb: Secondary | ICD-10-CM

## 2024-05-18 DIAGNOSIS — M65332 Trigger finger, left middle finger: Secondary | ICD-10-CM

## 2024-05-18 DIAGNOSIS — G5602 Carpal tunnel syndrome, left upper limb: Secondary | ICD-10-CM | POA: Diagnosis not present

## 2024-05-18 MED ORDER — LIDOCAINE HCL 1 % IJ SOLN
1.0000 mL | INTRAMUSCULAR | Status: AC | PRN
  Administered 2024-05-18: 1 mL

## 2024-05-18 MED ORDER — BETAMETHASONE SOD PHOS & ACET 6 (3-3) MG/ML IJ SUSP
6.0000 mg | INTRAMUSCULAR | Status: AC | PRN
  Administered 2024-05-18: 6 mg via INTRA_ARTICULAR

## 2024-05-25 DIAGNOSIS — Z85828 Personal history of other malignant neoplasm of skin: Secondary | ICD-10-CM | POA: Diagnosis not present

## 2024-05-25 DIAGNOSIS — D225 Melanocytic nevi of trunk: Secondary | ICD-10-CM | POA: Diagnosis not present

## 2024-05-25 DIAGNOSIS — L821 Other seborrheic keratosis: Secondary | ICD-10-CM | POA: Diagnosis not present

## 2024-05-25 DIAGNOSIS — L570 Actinic keratosis: Secondary | ICD-10-CM | POA: Diagnosis not present

## 2024-05-25 DIAGNOSIS — L814 Other melanin hyperpigmentation: Secondary | ICD-10-CM | POA: Diagnosis not present

## 2024-06-28 ENCOUNTER — Ambulatory Visit: Admitting: Orthopedic Surgery
